# Patient Record
Sex: Female | Born: 1985 | Race: White | Hispanic: No | Smoking: Never smoker
Health system: Southern US, Community
[De-identification: ages and names within clinical notes are randomized; demographics above are authoritative.]

## PROBLEM LIST (undated history)

## (undated) ENCOUNTER — Inpatient Hospital Stay (HOSPITAL_COMMUNITY): Payer: Self-pay

## (undated) DIAGNOSIS — R002 Palpitations: Secondary | ICD-10-CM

## (undated) DIAGNOSIS — O139 Gestational [pregnancy-induced] hypertension without significant proteinuria, unspecified trimester: Secondary | ICD-10-CM

## (undated) DIAGNOSIS — G510 Bell's palsy: Secondary | ICD-10-CM

## (undated) DIAGNOSIS — D649 Anemia, unspecified: Secondary | ICD-10-CM

## (undated) DIAGNOSIS — R87619 Unspecified abnormal cytological findings in specimens from cervix uteri: Secondary | ICD-10-CM

## (undated) DIAGNOSIS — Z789 Other specified health status: Secondary | ICD-10-CM

## (undated) DIAGNOSIS — IMO0002 Reserved for concepts with insufficient information to code with codable children: Secondary | ICD-10-CM

## (undated) HISTORY — DX: Unspecified abnormal cytological findings in specimens from cervix uteri: R87.619

## (undated) HISTORY — DX: Gestational (pregnancy-induced) hypertension without significant proteinuria, unspecified trimester: O13.9

## (undated) HISTORY — DX: Reserved for concepts with insufficient information to code with codable children: IMO0002

## (undated) HISTORY — PX: NO PAST SURGERIES: SHX2092

## (undated) HISTORY — DX: Bell's palsy: G51.0

## (undated) HISTORY — DX: Anemia, unspecified: D64.9

---
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
## 2007-06-18 ENCOUNTER — Other Ambulatory Visit: Admission: RE | Admit: 2007-06-18 | Discharge: 2007-06-18 | Payer: Self-pay | Admitting: Obstetrics and Gynecology

## 2007-07-28 ENCOUNTER — Other Ambulatory Visit: Admission: RE | Admit: 2007-07-28 | Discharge: 2007-07-28 | Payer: Self-pay | Admitting: Obstetrics & Gynecology

## 2007-09-29 ENCOUNTER — Encounter: Payer: Self-pay | Admitting: Cardiology

## 2008-03-08 ENCOUNTER — Other Ambulatory Visit: Admission: RE | Admit: 2008-03-08 | Discharge: 2008-03-08 | Payer: Self-pay | Admitting: Obstetrics and Gynecology

## 2008-04-22 ENCOUNTER — Encounter: Payer: Self-pay | Admitting: Cardiology

## 2008-05-21 ENCOUNTER — Ambulatory Visit: Payer: Self-pay | Admitting: Cardiology

## 2008-05-21 ENCOUNTER — Encounter: Payer: Self-pay | Admitting: Cardiology

## 2008-05-31 ENCOUNTER — Ambulatory Visit: Payer: Self-pay | Admitting: Cardiology

## 2008-05-31 ENCOUNTER — Encounter: Payer: Self-pay | Admitting: Cardiology

## 2008-08-30 ENCOUNTER — Other Ambulatory Visit: Admission: RE | Admit: 2008-08-30 | Discharge: 2008-08-30 | Payer: Self-pay | Admitting: Obstetrics and Gynecology

## 2009-05-05 ENCOUNTER — Encounter (INDEPENDENT_AMBULATORY_CARE_PROVIDER_SITE_OTHER): Payer: Self-pay | Admitting: *Deleted

## 2009-05-05 DIAGNOSIS — R002 Palpitations: Secondary | ICD-10-CM | POA: Insufficient documentation

## 2010-05-08 ENCOUNTER — Other Ambulatory Visit: Admission: RE | Admit: 2010-05-08 | Discharge: 2010-05-08 | Payer: Self-pay | Admitting: Obstetrics and Gynecology

## 2011-01-23 NOTE — Assessment & Plan Note (Signed)
[facility]                          [facility] OFFICE NOTE   Heather, Matthews                        MRN:          [id number]  DATE:05/21/2008                            DOB:          [birth year]    REFERRING Heather Matthews:  Heather Kayser, PA-C   REASON FOR REFERRAL:  Palpitations.   HISTORY OF PRESENT ILLNESS:  [name] is a 25 year old female patient  who essentially has a negative past medical history.  She recently saw  [name] at [facility] with complaints of palpitations.  She was  initially treated with Klonopin.  However, the patient tells me that she  felt lethargic with this and this was eventually discontinued and she  was placed on metoprolol.  She was set up for an echocardiogram on  April 26, 2008.  This was completely normal with an EF of 60-65% and no  remarkable valvular abnormalities.  Her recent lab work at Ms.  Heather Matthews's office revealed a hemoglobin of 11.2.  She had a TSH drawn in  January, 2009, that was normal at 2.796.  With continued symptoms, she  has been referred now to Dr. Andee Matthews for further evaluation and  treatment.   The patient notes a history of near-syncope approximately 7-8 months  ago.  At that time, she was working and going to school.  She is in  [employer] in [place].  She was under quite a bit of stress  and had noted at least 2 or 3 occasions where she became near syncopal.  This is associated with diaphoresis, nausea, and tinnitus.  She never  really had a syncopal episode.  She has had occasional orthostasis in  the past, but this is infrequent.  She finally quit her job and her  symptoms subsided.  At that point in time, she was not eating well and  she was also not sleeping very well.   Over the last 4 weeks, the patient has noted palpitations.  She  describes this as a hard heart beat.  It feels as though it goes on  for a few seconds and then subsides.  Sometimes it goes on  all day long.  She does note some shortness of breath associated with this as well as a  lightheadedness or dizziness.  She denies any syncope or near-syncope.  She does note some chest soreness associated with it from time to time.  She denies any tachy palpitations.  She denies any heavy caffeine use.  She really does not abuse over-the-counter cold medications.  She denies  any supplement use.  However, she does note a vitamin that she took  several months ago that she obtained from [facility].  She has not taken this  in quite some time.  She was not having palpitations when she was taking  this and only took it for about 30 days.  She is active and works out  several times a week.  She denies ever having a history of syncope with  exertion.   PAST MEDICAL HISTORY:  Negative.   MEDICATIONS:  1. Metoprolol ER 25  mg daily.  2. Birth control pills as directed.   ALLERGIES:  No known drug allergies.   SOCIAL HISTORY:  She denies any tobacco, alcohol, or drug abuse.  She is  in school as outlined above.   FAMILY HISTORY:  Insignificant for premature CAD and first degree  relatives.   REVIEW OF SYSTEMS:  Please see HPI.  She denies any fevers, chills,  cough, melena, hematochezia, or hematuria.  Rest of the review of  systems are negative.   PHYSICAL EXAMINATION:  GENERAL:  She is well-nourished and well-  developed female.  VITAL SIGNS:  Blood pressure is 120/68, pulse 88, and weight 149 pounds.  HEENT:  Normal.  NECK:  Without JVD.  CARDIAC:  Normal S1 and S2.  Regular rate and rhythm without murmur.  LUNGS:  Clear to auscultation bilaterally without wheezes, rhonchi, or  rales.  ABDOMEN:  Soft and nontender with normoactive bowel sounds.  No  organomegaly.  EXTREMITIES:  Without edema.  Calves soft and nontender.  SKIN:  Warm and dry.  NEUROLOGIC:  She is alert and oriented x3.  Cranial II through XII are  grossly intact.  VASCULAR:  Without carotids bruits bilaterally.   ENDOCRINE:  Without thyromegaly.   Electrocardiogram reveals sinus rhythm with heart rate of 78, normal  axis, and no acute changes.   ASSESSMENT AND PLAN:  Palpitations.  She has had a recent echocardiogram  that reveals normal left ventricular structure and function as well as  no significant valvular abnormalities.  Her electrocardiogram is normal  today.  I suspect she is experiencing premature atrial contractions  versus premature ventricular contractions.  They are quite symptomatic.  She did not really have much in the way of relief with anxiolytics.  She  has not noticed much of a change with taking a beta-blocker.  However,  she only took this for a few days and has not taken it in quite some  time.  She has not had any symptoms in the last week.  We will set the  patient up for a 30-day CardioNet event monitor.  We will also switch  her metoprolol over to propranolol 20 mg daily p.r.n.  We will also  check orthostatic vital signs in the office today.  This will rule out  significant orthostatic blood pressure drop or heart rate increase.  At  this point in time, I do not think she has postural orthostatic  tachycardia syndrome.  The patient was also interviewed and examined by  Dr. Andee Matthews today.   DISPOSITION:  The patient will be brought back in follow up in the next  2 months to review the results of her event monitor.      Tereso Newcomer, PA-C  Electronically Signed      Learta Codding, MD,FACC  Electronically Signed   SW/MedQ  DD: 05/21/2008  DT: 05/22/2008  Job #: [id number]   cc:   Heather Kayser, PA-C

## 2012-04-24 ENCOUNTER — Other Ambulatory Visit: Payer: Self-pay | Admitting: Adult Health

## 2012-04-24 ENCOUNTER — Other Ambulatory Visit (HOSPITAL_COMMUNITY)
Admission: RE | Admit: 2012-04-24 | Discharge: 2012-04-24 | Disposition: A | Discharge status: Home / Self Care | Payer: Medicaid Other | Source: Ambulatory Visit | Attending: Obstetrics and Gynecology | Admitting: Obstetrics and Gynecology

## 2012-04-24 DIAGNOSIS — Z113 Encounter for screening for infections with a predominantly sexual mode of transmission: Secondary | ICD-10-CM | POA: Insufficient documentation

## 2012-04-24 DIAGNOSIS — Z01419 Encounter for gynecological examination (general) (routine) without abnormal findings: Secondary | ICD-10-CM | POA: Insufficient documentation

## 2012-04-24 LAB — OB RESULTS CONSOLE RUBELLA ANTIBODY, IGM: Rubella: IMMUNE

## 2012-04-24 LAB — OB RESULTS CONSOLE ANTIBODY SCREEN: Antibody Screen: NEGATIVE

## 2012-04-24 LAB — OB RESULTS CONSOLE VARICELLA ZOSTER ANTIBODY, IGG: Varicella: IMMUNE

## 2012-04-24 LAB — OB RESULTS CONSOLE ABO/RH: RH Type: POSITIVE

## 2012-04-24 LAB — OB RESULTS CONSOLE RPR: RPR: NONREACTIVE

## 2012-04-24 LAB — OB RESULTS CONSOLE HIV ANTIBODY (ROUTINE TESTING): HIV: NONREACTIVE

## 2012-04-24 LAB — OB RESULTS CONSOLE HEPATITIS B SURFACE ANTIGEN: Hepatitis B Surface Ag: NEGATIVE

## 2012-07-19 ENCOUNTER — Inpatient Hospital Stay (HOSPITAL_COMMUNITY)
Admission: AD | Admit: 2012-07-19 | Discharge: 2012-07-19 | Disposition: A | Discharge status: Home / Self Care | Payer: Medicaid Other | Source: Ambulatory Visit | Attending: Obstetrics and Gynecology | Admitting: Obstetrics and Gynecology

## 2012-07-19 ENCOUNTER — Encounter (HOSPITAL_COMMUNITY): Payer: Self-pay | Admitting: *Deleted

## 2012-07-19 DIAGNOSIS — O368190 Decreased fetal movements, unspecified trimester, not applicable or unspecified: Secondary | ICD-10-CM | POA: Insufficient documentation

## 2012-07-19 HISTORY — DX: Other specified health status: Z78.9

## 2012-07-19 LAB — URINALYSIS, ROUTINE W REFLEX MICROSCOPIC
Bilirubin Urine: NEGATIVE
Glucose, UA: NEGATIVE mg/dL
Hgb urine dipstick: NEGATIVE
Ketones, ur: NEGATIVE mg/dL
Leukocytes, UA: NEGATIVE
Nitrite: NEGATIVE
Protein, ur: NEGATIVE mg/dL
Specific Gravity, Urine: <1.005 — ABNORMAL LOW (ref 1.005–1.030)
Urobilinogen, UA: 0.2 mg/dL (ref 0.0–1.0)
pH: 6 (ref 5.0–8.0)

## 2012-07-19 NOTE — MAU Provider Note (Signed)
History     CSN: [id number]  Arrival date & time 07/19/12  1408   None     Chief Complaint  Patient presents with  . Decreased Fetal Movement    (Consider location/radiation/quality/duration/timing/severity/associated sxs/prior treatment) HPI Heather Matthews is a 26 y.o. G1P0 at [redacted]w[redacted]d. She presents with c/o no fetal movement since last evening. No bleeding or leaking. Has bil low abd pressure off/on, not new. PNC with [facility], last visit this week.  Past Medical History  Diagnosis Date  . No pertinent past medical history     Past Surgical History  Procedure Date  . No past surgeries     No family history on file.  History  Substance Use Topics  . Smoking status: Never Smoker   . Smokeless tobacco: Not on file  . Alcohol Use: No    OB History    Grav Para Term Preterm Abortions TAB SAB Ect Mult Living   1               Review of Systems  Constitutional: Negative for fever and chills.  Gastrointestinal: Negative for diarrhea and constipation.  Genitourinary: Negative for dysuria, urgency, frequency, vaginal bleeding and vaginal discharge.       Pelvic pressure    Allergies  Review of patient's allergies indicates not on file.  Home Medications  No current outpatient prescriptions on file.  BP 135/70  Pulse 98  Temp 98.1 F (36.7 C) (Oral)  Resp 18  Ht 5\' 2"  (1.575 m)  Wt 165 lb 6.4 oz (75.025 kg)  BMI 30.25 kg/m2  Physical Exam  Constitutional: She is oriented to person, place, and time. She appears well-developed and well-nourished.  Musculoskeletal: Normal range of motion.  Neurological: She is alert and oriented to person, place, and time.  Skin: Skin is warm and dry.  Psychiatric: She has a normal mood and affect. Her behavior is normal.    ED Course  Procedures (including critical care time)   Labs Reviewed  URINALYSIS, ROUTINE W REFLEX MICROSCOPIC   No results found.  Results for orders placed during the hospital encounter of  07/19/12 (from the past 24 hour(s))  URINALYSIS, ROUTINE W REFLEX MICROSCOPIC     Status: Abnormal   Collection Time   07/19/12  2:28 PM      Component Value Range   Color, Urine YELLOW  YELLOW   APPearance CLEAR  CLEAR   Specific Gravity, Urine <1.005 (*) 1.005 - 1.030   pH 6.0  5.0 - 8.0   Glucose, UA NEGATIVE  NEGATIVE mg/dL   Hgb urine dipstick NEGATIVE  NEGATIVE   Bilirubin Urine NEGATIVE  NEGATIVE   Ketones, ur NEGATIVE  NEGATIVE mg/dL   Protein, ur NEGATIVE  NEGATIVE mg/dL   Urobilinogen, UA 0.2  0.0 - 1.0 mg/dL   Nitrite NEGATIVE  NEGATIVE   Leukocytes, UA NEGATIVE  NEGATIVE    No diagnosis found.    MDM  A- Normal pregnancy, good fetal heart tones, neg urine  P- Continue to monitor symptoms Keep scheduled prenatal visit in 3 wks at [facility]

## 2012-07-19 NOTE — MAU Note (Signed)
Pt stated she has not felt baby move since last night. Has been feeling regular movment for several weeks now. Pt reports having increased vaginal pressure and ligament pain.

## 2012-08-20 ENCOUNTER — Other Ambulatory Visit: Payer: Self-pay | Admitting: Adult Health

## 2012-08-20 ENCOUNTER — Other Ambulatory Visit (HOSPITAL_COMMUNITY)
Admission: RE | Admit: 2012-08-20 | Discharge: 2012-08-20 | Disposition: A | Discharge status: Home / Self Care | Payer: Self-pay | Source: Ambulatory Visit | Attending: Obstetrics and Gynecology | Admitting: Obstetrics and Gynecology

## 2012-08-20 DIAGNOSIS — N6459 Other signs and symptoms in breast: Secondary | ICD-10-CM | POA: Insufficient documentation

## 2012-09-08 LAB — OB RESULTS CONSOLE HIV ANTIBODY (ROUTINE TESTING): HIV: NONREACTIVE

## 2012-09-08 LAB — OB RESULTS CONSOLE RPR: RPR: NONREACTIVE

## 2012-09-10 NOTE — L&D Delivery Note (Signed)
Delivery Note Pt had pushed a total of 3 hours with rests inbetween and was becoming exhausted. Dr Jolayne Panther was requested to come and evaluate the situation for appropriateness of vacuum for assistance. The pt and family were in agreement after the R/Bs were reviewed. The Kiwi vacuum was applied and gentle traction was used by Dr Jolayne Panther without any popoffs until the vtx was crowning at which point the vacuum was removed and Dr Konrad Dolores completed the birth.   At 7:04 AM a viable female was delivered via Vaginal, Vacuum (Extractor) (Presentation: ; Occiput Anterior).  APGAR: 8, 9; weight 7 lb 6.9 oz (3370 g).   Placenta status: Intact, Spontaneous.  Cord: 3 vessels with the following complications: Short.     Anesthesia: Epidural  Episiotomy: None Lacerations: None Est. Blood Loss (mL): 200  Mom to postpartum.  Baby to nursery-stable.  MERRELL, DAVID, MD Family Medicine Resident PGY2 12/18/2012, 7:25 AM  I have seen and examined this patient and I agree with the above. I was present for the delivery. Mansfield, Chevon Fomby 9:02 AM 12/20/2012

## 2012-09-10 NOTE — L&D Delivery Note (Signed)
Attestation of Attending Supervision of Advanced Practitioner (CNM/NP): Evaluation and management procedures were performed by the Advanced Practitioner under my supervision and collaboration.  I have reviewed the Advanced Practitioner's note and chart, and I agree with the management and plan.  Heather Matthews 12/23/2012 8:34 AM

## 2012-11-07 ENCOUNTER — Encounter (HOSPITAL_COMMUNITY): Payer: Self-pay

## 2012-11-07 ENCOUNTER — Inpatient Hospital Stay (HOSPITAL_COMMUNITY)
Admission: AD | Admit: 2012-11-07 | Discharge: 2012-11-07 | Disposition: A | Discharge status: Home / Self Care | Payer: Medicaid Other | Source: Ambulatory Visit | Attending: Obstetrics and Gynecology | Admitting: Obstetrics and Gynecology

## 2012-11-07 DIAGNOSIS — O99891 Other specified diseases and conditions complicating pregnancy: Secondary | ICD-10-CM | POA: Insufficient documentation

## 2012-11-07 DIAGNOSIS — R079 Chest pain, unspecified: Secondary | ICD-10-CM | POA: Insufficient documentation

## 2012-11-07 DIAGNOSIS — M545 Low back pain, unspecified: Secondary | ICD-10-CM | POA: Insufficient documentation

## 2012-11-07 DIAGNOSIS — R0602 Shortness of breath: Secondary | ICD-10-CM | POA: Insufficient documentation

## 2012-11-07 DIAGNOSIS — K529 Noninfective gastroenteritis and colitis, unspecified: Secondary | ICD-10-CM

## 2012-11-07 DIAGNOSIS — A088 Other specified intestinal infections: Secondary | ICD-10-CM | POA: Insufficient documentation

## 2012-11-07 DIAGNOSIS — R197 Diarrhea, unspecified: Secondary | ICD-10-CM | POA: Insufficient documentation

## 2012-11-07 DIAGNOSIS — K5289 Other specified noninfective gastroenteritis and colitis: Secondary | ICD-10-CM

## 2012-11-07 HISTORY — DX: Other specified health status: Z78.9

## 2012-11-07 LAB — URINALYSIS, ROUTINE W REFLEX MICROSCOPIC
Bilirubin Urine: NEGATIVE
Glucose, UA: NEGATIVE mg/dL
Ketones, ur: 40 mg/dL — AB
Nitrite: NEGATIVE
Protein, ur: NEGATIVE mg/dL
Specific Gravity, Urine: 1.02 (ref 1.005–1.030)
Urobilinogen, UA: 0.2 mg/dL (ref 0.0–1.0)
pH: 6 (ref 5.0–8.0)

## 2012-11-07 LAB — URINE MICROSCOPIC-ADD ON

## 2012-11-07 MED ORDER — ONDANSETRON 4 MG PO TBDP: 4.0000 mg | ORAL_TABLET | Freq: Three times a day (TID) | ORAL | Status: DC | PRN

## 2012-11-07 NOTE — MAU Note (Signed)
Had back pain this morning. Went to work. This afternoon started having nausea, more back pain and shortness of breath. Diarrhea for 2 days

## 2012-11-07 NOTE — MAU Provider Note (Signed)
History     CSN: [id number]  Arrival date and time: 11/07/12 2157   None     Chief Complaint  Patient presents with  . Back Pain  . Nausea  . Shortness of Breath   HPI Ms Heather Matthews is a 27 yo G1P0 at 35.4wks who presents for eval of occ diarrhea x 2d, nausea today and SOB/chest tightness today. No cough/fever.  Also with lower back/tailbone discomfort. Denies leak or bldg or reg ctx. Reports +FM. She receives prenatal care at [facility].  OB History   Grav Para Term Preterm Abortions TAB SAB Ect Mult Living   1 0 0 0 0 0 0 0 0 0       Past Medical History  Diagnosis Date  . No pertinent past medical history   . Medical history non-contributory     Past Surgical History  Procedure Laterality Date  . No past surgeries      History reviewed. No pertinent family history.  History  Substance Use Topics  . Smoking status: Never Smoker   . Smokeless tobacco: Not on file  . Alcohol Use: No    Allergies: No Known Allergies  Prescriptions prior to admission  Medication Sig Dispense Refill  . acetaminophen (TYLENOL) 325 MG tablet Take 325 mg by mouth every 6 (six) hours as needed. For headache      . flintstones complete (FLINTSTONES) 60 MG chewable tablet Chew 2 tablets by mouth daily.      . ondansetron (ZOFRAN-ODT) 8 MG disintegrating tablet Take 8 mg by mouth every 8 (eight) hours as needed. For nausea and vomiting      . [DISCONTINUED] promethazine (PHENERGAN) 12.5 MG tablet Take 12.5 mg by mouth every 8 (eight) hours as needed. For nausea        ROS Physical Exam   Blood pressure 143/78, pulse 111, temperature 97.6 F (36.4 C), temperature source Oral, resp. rate 22, height 5' 1.5" (1.562 m), weight 187 lb (84.823 kg), SpO2 99.00%.  Physical Exam  Constitutional: She is oriented to person, place, and time. She appears well-developed.  HENT:  Head: Normocephalic.  Neck: Normal range of motion.  Cardiovascular:  Tachycardic initially, then near d/c pulse 103   Respiratory: Effort normal and breath sounds normal.  Lungs clear in all quads, no wheezes or rales  GI:  Gravid FHR 135 +accels, no decels Rare ctx per toco  Genitourinary: Vagina normal.  Cx closed/50%  Musculoskeletal: Normal range of motion.  Neurological: She is alert and oriented to person, place, and time.  Skin: Skin is warm and dry.  Psychiatric: She has a normal mood and affect. Her behavior is normal. Thought content normal.   Urinalysis    Component Value Date/Time   COLORURINE YELLOW 11/07/2012 2200   APPEARANCEUR CLEAR 11/07/2012 2200   LABSPEC 1.020 11/07/2012 2200   PHURINE 6.0 11/07/2012 2200   GLUCOSEU NEGATIVE 11/07/2012 2200   HGBUR TRACE* 11/07/2012 2200   BILIRUBINUR NEGATIVE 11/07/2012 2200   KETONESUR 40* 11/07/2012 2200   PROTEINUR NEGATIVE 11/07/2012 2200   UROBILINOGEN 0.2 11/07/2012 2200   NITRITE NEGATIVE 11/07/2012 2200   LEUKOCYTESUR SMALL* 11/07/2012 2200      MAU Course  Procedures  MDM Allowed pt to rest and drink fluids; feels that breathing is easier but chest feels slightly tight; will hydrate with PO fluids rather than IV.  Assessment and Plan  IUP at 35.4wks Viral gastroenteritis  D/C home Rev'd instructions to rest, increase fluids, Zofran rx for prn F/U at  [facility] as scheduled on 3/3   Cam Hai 11/07/2012, 11:47 PM

## 2012-11-12 NOTE — MAU Provider Note (Signed)
Attestation of Attending Supervision of Advanced Practitioner (CNM/NP): Evaluation and management procedures were performed by the Advanced Practitioner under my supervision and collaboration.  I have reviewed the Advanced Practitioner's note and chart, and I agree with the management and plan.  Zohan Shiflet 11/12/2012 9:24 AM

## 2012-11-17 LAB — OB RESULTS CONSOLE GBS: GBS: NEGATIVE

## 2012-11-17 LAB — OB RESULTS CONSOLE GC/CHLAMYDIA: Chlamydia: NEGATIVE

## 2012-11-20 ENCOUNTER — Inpatient Hospital Stay (HOSPITAL_COMMUNITY)
Admission: AD | Admit: 2012-11-20 | Discharge: 2012-11-21 | Disposition: A | Discharge status: Home / Self Care | Payer: Medicaid Other | Source: Ambulatory Visit | Attending: Obstetrics and Gynecology | Admitting: Obstetrics and Gynecology

## 2012-11-20 DIAGNOSIS — O479 False labor, unspecified: Secondary | ICD-10-CM | POA: Insufficient documentation

## 2012-11-20 NOTE — MAU Note (Signed)
Pt states she has had contractions q 5-10 min apart since 1800

## 2012-11-21 NOTE — Discharge Instructions (Signed)
Fetal Movement Counts Patient Name: __________________________________________________ Patient Due Date: ____________________ Kick counts is highly recommended in high risk pregnancies, but it is a good idea for every pregnant woman to do. Start counting fetal movements at 28 weeks of the pregnancy. Fetal movements increase after eating a full meal or eating or drinking something sweet (the blood sugar is higher). It is also important to drink plenty of fluids (well hydrated) before doing the count. Lie on your left side because it helps with the circulation or you can sit in a comfortable chair with your arms over your belly (abdomen) with no distractions around you. DOING THE COUNT  Try to do the count the same time of day each time you do it.  Mark the day and time, then see how long it takes for you to feel 10 movements (kicks, flutters, swishes, rolls). You should have at least 10 movements within 2 hours. You will most likely feel 10 movements in much less than 2 hours. If you do not, wait an hour and count again. After a couple of days you will see a pattern.  What you are looking for is a change in the pattern or not enough counts in 2 hours. Is it taking longer in time to reach 10 movements? SEEK MEDICAL CARE IF:  You feel less than 10 counts in 2 hours. Tried twice.  No movement in one hour.  The pattern is changing or taking longer each day to reach 10 counts in 2 hours.  You feel the baby is not moving as it usually does. Date: ____________ Movements: ____________ Start time: ____________ Finish time: ____________  Date: ____________ Movements: ____________ Start time: ____________ Finish time: ____________ Date: ____________ Movements: ____________ Start time: ____________ Finish time: ____________ Date: ____________ Movements: ____________ Start time: ____________ Finish time: ____________ Date: ____________ Movements: ____________ Start time: ____________ Finish time:  ____________ Date: ____________ Movements: ____________ Start time: ____________ Finish time: ____________ Date: ____________ Movements: ____________ Start time: ____________ Finish time: ____________ Date: ____________ Movements: ____________ Start time: ____________ Finish time: ____________  Date: ____________ Movements: ____________ Start time: ____________ Finish time: ____________ Date: ____________ Movements: ____________ Start time: ____________ Finish time: ____________ Date: ____________ Movements: ____________ Start time: ____________ Finish time: ____________ Date: ____________ Movements: ____________ Start time: ____________ Finish time: ____________ Date: ____________ Movements: ____________ Start time: ____________ Finish time: ____________ Date: ____________ Movements: ____________ Start time: ____________ Finish time: ____________ Date: ____________ Movements: ____________ Start time: ____________ Finish time: ____________  Date: ____________ Movements: ____________ Start time: ____________ Finish time: ____________ Date: ____________ Movements: ____________ Start time: ____________ Finish time: ____________ Date: ____________ Movements: ____________ Start time: ____________ Finish time: ____________ Date: ____________ Movements: ____________ Start time: ____________ Finish time: ____________ Date: ____________ Movements: ____________ Start time: ____________ Finish time: ____________ Date: ____________ Movements: ____________ Start time: ____________ Finish time: ____________ Date: ____________ Movements: ____________ Start time: ____________ Finish time: ____________  Date: ____________ Movements: ____________ Start time: ____________ Finish time: ____________ Date: ____________ Movements: ____________ Start time: ____________ Finish time: ____________ Date: ____________ Movements: ____________ Start time: ____________ Finish time: ____________ Date: ____________ Movements:  ____________ Start time: ____________ Finish time: ____________ Date: ____________ Movements: ____________ Start time: ____________ Finish time: ____________ Date: ____________ Movements: ____________ Start time: ____________ Finish time: ____________ Date: ____________ Movements: ____________ Start time: ____________ Finish time: ____________  Date: ____________ Movements: ____________ Start time: ____________ Finish time: ____________ Date: ____________ Movements: ____________ Start time: ____________ Finish time: ____________ Date: ____________ Movements: ____________ Start time: ____________ Finish time: ____________ Date: ____________ Movements:   ____________ Start time: ____________ Finish time: ____________ Date: ____________ Movements: ____________ Start time: ____________ Finish time: ____________ Date: ____________ Movements: ____________ Start time: ____________ Finish time: ____________ Date: ____________ Movements: ____________ Start time: ____________ Finish time: ____________  Date: ____________ Movements: ____________ Start time: ____________ Finish time: ____________ Date: ____________ Movements: ____________ Start time: ____________ Finish time: ____________ Date: ____________ Movements: ____________ Start time: ____________ Finish time: ____________ Date: ____________ Movements: ____________ Start time: ____________ Finish time: ____________ Date: ____________ Movements: ____________ Start time: ____________ Finish time: ____________ Date: ____________ Movements: ____________ Start time: ____________ Finish time: ____________ Date: ____________ Movements: ____________ Start time: ____________ Finish time: ____________  Date: ____________ Movements: ____________ Start time: ____________ Finish time: ____________ Date: ____________ Movements: ____________ Start time: ____________ Finish time: ____________ Date: ____________ Movements: ____________ Start time: ____________ Finish  time: ____________ Date: ____________ Movements: ____________ Start time: ____________ Finish time: ____________ Date: ____________ Movements: ____________ Start time: ____________ Finish time: ____________ Date: ____________ Movements: ____________ Start time: ____________ Finish time: ____________ Date: ____________ Movements: ____________ Start time: ____________ Finish time: ____________  Date: ____________ Movements: ____________ Start time: ____________ Finish time: ____________ Date: ____________ Movements: ____________ Start time: ____________ Finish time: ____________ Date: ____________ Movements: ____________ Start time: ____________ Finish time: ____________ Date: ____________ Movements: ____________ Start time: ____________ Finish time: ____________ Date: ____________ Movements: ____________ Start time: ____________ Finish time: ____________ Date: ____________ Movements: ____________ Start time: ____________ Finish time: ____________ Document Released: 09/26/2006 Document Revised: 11/19/2011 Document Reviewed: 03/29/2009 [employer] Patient Information 2013 [employer].  Braxton Hicks Contractions Pregnancy is commonly associated with contractions of the uterus throughout the pregnancy. Towards the end of pregnancy (32 to 34 weeks), these contractions (Braxton Hicks) can develop more often and may become more forceful. This is not true labor because these contractions do not result in opening (dilatation) and thinning of the cervix. They are sometimes difficult to tell apart from true labor because these contractions can be forceful and people have different pain tolerances. You should not feel embarrassed if you go to the hospital with false labor. Sometimes, the only way to tell if you are in true labor is for your caregiver to follow the changes in the cervix. How to tell the difference between true and false labor:  False labor.  The contractions of false labor are usually  shorter, irregular and not as hard as those of true labor.  They are often felt in the front of the lower abdomen and in the groin.  They may leave with walking around or changing positions while lying down.  They get weaker and are shorter lasting as time goes on.  These contractions are usually irregular.  They do not usually become progressively stronger, regular and closer together as with true labor.  True labor.  Contractions in true labor last 30 to 70 seconds, become very regular, usually become more intense, and increase in frequency.  They do not go away with walking.  The discomfort is usually felt in the top of the uterus and spreads to the lower abdomen and low back.  True labor can be determined by your caregiver with an exam. This will show that the cervix is dilating and getting thinner. If there are no prenatal problems or other health problems associated with the pregnancy, it is completely safe to be sent home with false labor and await the onset of true labor. HOME CARE INSTRUCTIONS   Keep up with your usual exercises and instructions.  Take medications as directed.  Keep your regular prenatal appointment.    Eat and drink lightly if you think you are going into labor.  If BH contractions are making you uncomfortable:  Change your activity position from lying down or resting to walking/walking to resting.  Sit and rest in a tub of warm water.  Drink 2 to 3 glasses of water. Dehydration may cause B-H contractions.  Do slow and deep breathing several times an hour. SEEK IMMEDIATE MEDICAL CARE IF:   Your contractions continue to become stronger, more regular, and closer together.  You have a gushing, burst or leaking of fluid from the vagina.  An oral temperature above 102 F (38.9 C) develops.  You have passage of blood-tinged mucus.  You develop vaginal bleeding.  You develop continuous belly (abdominal) pain.  You have low back pain that you  never had before.  You feel the baby's head pushing down causing pelvic pressure.  The baby is not moving as much as it used to. Document Released: 08/27/2005 Document Revised: 11/19/2011 Document Reviewed: 02/18/2009 [employer] Patient Information 2013 [employer].  

## 2012-11-22 ENCOUNTER — Encounter: Payer: Self-pay | Admitting: *Deleted

## 2012-11-26 ENCOUNTER — Telehealth: Payer: Self-pay | Admitting: *Deleted

## 2012-11-26 NOTE — Telephone Encounter (Signed)
Pt called and c/o contractions and pressure. She denied any gush of fluid or bleeding. She did have positive baby movement. She stated that she had not timed her contractions. I advised the pt time the contractions for the next hour and call me back and let me know how far apart they are. I also advised that if her contractions were 5 to 7 minutes apart she needed to go straight to [facility]. Pt voiced understanding.

## 2012-11-26 NOTE — Telephone Encounter (Signed)
Called pt back to check on her, asked her how far apart her contractions were and she stated that they were not close together at all and that they were not consistent. I advised her to go straight to [facility] if she had a gush of fluid, bleeding, or decreased fetal movement. Pt verbalized understanding.

## 2012-12-01 ENCOUNTER — Encounter: Payer: Self-pay | Admitting: Obstetrics and Gynecology

## 2012-12-01 ENCOUNTER — Encounter (HOSPITAL_COMMUNITY): Payer: Self-pay | Admitting: *Deleted

## 2012-12-01 ENCOUNTER — Telehealth: Payer: Self-pay | Admitting: Adult Health

## 2012-12-01 ENCOUNTER — Inpatient Hospital Stay (HOSPITAL_COMMUNITY)
Admission: AD | Admit: 2012-12-01 | Discharge: 2012-12-01 | Disposition: A | Discharge status: Home / Self Care | Payer: Medicaid Other | Source: Ambulatory Visit | Attending: Obstetrics & Gynecology | Admitting: Obstetrics & Gynecology

## 2012-12-01 DIAGNOSIS — N760 Acute vaginitis: Secondary | ICD-10-CM | POA: Insufficient documentation

## 2012-12-01 DIAGNOSIS — A499 Bacterial infection, unspecified: Secondary | ICD-10-CM | POA: Insufficient documentation

## 2012-12-01 DIAGNOSIS — O479 False labor, unspecified: Secondary | ICD-10-CM | POA: Insufficient documentation

## 2012-12-01 DIAGNOSIS — B9689 Other specified bacterial agents as the cause of diseases classified elsewhere: Secondary | ICD-10-CM

## 2012-12-01 DIAGNOSIS — O471 False labor at or after 37 completed weeks of gestation: Secondary | ICD-10-CM

## 2012-12-01 DIAGNOSIS — O2390 Unspecified genitourinary tract infection in pregnancy, unspecified trimester: Secondary | ICD-10-CM | POA: Insufficient documentation

## 2012-12-01 LAB — PROTEIN / CREATININE RATIO, URINE
Creatinine, Urine: 79.48 mg/dL
Protein Creatinine Ratio: 0.08 (ref 0.00–0.15)
Total Protein, Urine: 6.7 mg/dL

## 2012-12-01 LAB — CBC
HCT: 33.1 % — ABNORMAL LOW (ref 36.0–46.0)
Hemoglobin: 10.8 g/dL — ABNORMAL LOW (ref 12.0–15.0)
MCH: 26.7 pg (ref 26.0–34.0)
MCHC: 32.6 g/dL (ref 30.0–36.0)
MCV: 81.9 fL (ref 78.0–100.0)
Platelets: 324 10*3/uL (ref 150–400)
RBC: 4.04 MIL/uL (ref 3.87–5.11)
RDW: 14.4 % (ref 11.5–15.5)
WBC: 13.9 10*3/uL — ABNORMAL HIGH (ref 4.0–10.5)

## 2012-12-01 LAB — URINALYSIS, DIPSTICK ONLY
Ketones, ur: 15 mg/dL — AB
Leukocytes, UA: NEGATIVE
Nitrite: NEGATIVE
Protein, ur: NEGATIVE mg/dL
Urobilinogen, UA: 0.2 mg/dL (ref 0.0–1.0)

## 2012-12-01 LAB — COMPREHENSIVE METABOLIC PANEL
ALT: 5 U/L (ref 0–35)
AST: 11 U/L (ref 0–37)
Albumin: 2.6 g/dL — ABNORMAL LOW (ref 3.5–5.2)
Alkaline Phosphatase: 162 U/L — ABNORMAL HIGH (ref 39–117)
BUN: 10 mg/dL (ref 6–23)
CO2: 19 mEq/L (ref 19–32)
Calcium: 9.3 mg/dL (ref 8.4–10.5)
Chloride: 102 mEq/L (ref 96–112)
Creatinine, Ser: 0.48 mg/dL — ABNORMAL LOW (ref 0.50–1.10)
GFR calc Af Amer: >90 mL/min (ref >90)
GFR calc non Af Amer: >90 mL/min (ref >90)
Glucose, Bld: 76 mg/dL (ref 70–99)
Potassium: 4.1 mEq/L (ref 3.5–5.1)
Sodium: 134 mEq/L — ABNORMAL LOW (ref 135–145)
Total Bilirubin: 0.2 mg/dL — ABNORMAL LOW (ref 0.3–1.2)
Total Protein: 6.3 g/dL (ref 6.0–8.3)

## 2012-12-01 LAB — POCT FERN TEST: POCT Fern Test: NEGATIVE

## 2012-12-01 LAB — WET PREP, GENITAL
Trich, Wet Prep: NONE SEEN
Yeast Wet Prep HPF POC: NONE SEEN

## 2012-12-01 MED ORDER — METRONIDAZOLE 500 MG PO TABS: 500.0000 mg | ORAL_TABLET | Freq: Two times a day (BID) | ORAL | Status: DC

## 2012-12-01 NOTE — MAU Note (Signed)
Patient states she has had a couple of episodes of leaking clear fluid twice yesterday. The first time at 1900 3-23. None today. Patient states she has been having contractions every 4-5 minutes. Reports not feeling any movement today. Fetal heart tones in the 120's in triage.

## 2012-12-01 NOTE — Telephone Encounter (Signed)
Spoke with Megans boyfriend, they are at the hospital now. Not ruptured, call if needs any thing.

## 2012-12-01 NOTE — MAU Provider Note (Signed)
History   27 y/o G1P0 at [redacted]w[redacted]d with borderline blood pressures presenting with leakage of fluid for 1 day  CSN: [id number]  Arrival date and time: 12/01/12 1203   None     Chief Complaint  Patient presents with  . Vaginal Discharge  . Labor Eval   Vaginal Discharge The patient's primary symptoms include a vaginal discharge. The patient's pertinent negatives include no genital itching, genital lesions, genital odor, pelvic pain or vaginal bleeding. This is a new problem. The current episode started today. The problem occurs constantly. The problem has been unchanged. The pain is mild. The problem affects both sides. She is pregnant. Associated symptoms include abdominal pain and back pain. Pertinent negatives include no chills, constipation, diarrhea, dysuria, fever, flank pain, frequency, headaches, joint pain, nausea, rash, sore throat, urgency or vomiting. The vaginal discharge was clear and watery. There has been no bleeding. She has not been passing clots. She has not been passing tissue. Nothing aggravates the symptoms. She has tried nothing for the symptoms. The treatment provided no relief. She is sexually active. No, her partner does not have an STD. She uses nothing for contraception.   # Leakage of fluid: Pt mentions developing leakage of fluid since yesterday afternoon described a clear fluid with constant leak.  Associated with decreased fetal movement and contraction like pain over her lower abdomen that is 8/10 with contraction.  Pt denies blood, pain with urination or increased urinary frequency, vaginal itching or discharge.    OB History   Grav Para Term Preterm Abortions TAB SAB Ect Mult Living   1 0 0 0 0 0 0 0 0 0       Past Medical History  Diagnosis Date  . No pertinent past medical history   . Medical history non-contributory   . Abnormal Pap smear   . Anemia     Past Surgical History  Procedure Laterality Date  . No past surgeries      Family History   Problem Relation Age of Onset  . Diabetes Other   . Hypertension Other     History  Substance Use Topics  . Smoking status: Never Smoker   . Smokeless tobacco: Not on file  . Alcohol Use: No    Allergies: No Known Allergies  Prescriptions prior to admission  Medication Sig Dispense Refill  . calcium carbonate (TUMS - DOSED IN MG ELEMENTAL CALCIUM) 500 MG chewable tablet Chew 3 tablets by mouth 3 (three) times daily as needed for heartburn.      . flintstones complete (FLINTSTONES) 60 MG chewable tablet Chew 2 tablets by mouth daily.      . ondansetron (ZOFRAN ODT) 4 MG disintegrating tablet Take 1 tablet (4 mg total) by mouth every 8 (eight) hours as needed for nausea.  30 tablet  1    Review of Systems  Constitutional: Negative for fever and chills.  HENT: Negative for sore throat.   Eyes: Negative for blurred vision and double vision.  Respiratory: Negative for cough and wheezing.   Cardiovascular: Negative for chest pain, palpitations and leg swelling.  Gastrointestinal: Positive for abdominal pain. Negative for heartburn, nausea, vomiting, diarrhea and constipation.  Genitourinary: Positive for vaginal discharge. Negative for dysuria, urgency, frequency, flank pain and pelvic pain.  Musculoskeletal: Positive for back pain. Negative for myalgias and joint pain.  Skin: Negative for itching and rash.  Neurological: Negative for headaches.  Endo/Heme/Allergies: Negative for environmental allergies. Does not bruise/bleed easily.  All other systems reviewed and are  negative.   Physical Exam   Blood pressure 131/73, pulse 99, temperature 98 F (36.7 C), temperature source Oral, resp. rate 18, height 5\' 2"  (1.575 m), weight 87.181 kg (192 lb 3.2 oz), last menstrual period 02/19/2012, SpO2 100.00%.  Physical Exam  Constitutional: She is oriented to person, place, and time. She appears well-developed and well-nourished. No distress.  HENT:  Head: Normocephalic and atraumatic.   Eyes: EOM are normal. Pupils are equal, round, and reactive to light.  Neck: Normal range of motion. Neck supple.  Cardiovascular: Normal rate, regular rhythm and normal heart sounds.  Exam reveals no gallop.   No murmur heard. Respiratory: Effort normal and breath sounds normal. No respiratory distress. She has no wheezes. She has no rales.  GI: Soft. Bowel sounds are normal. She exhibits no distension. There is no tenderness. There is no rebound, no guarding and no CVA tenderness.  Genitourinary: Vagina normal. No vaginal discharge found.  Sterile speculum exam: no pooling of clear fluid w/in the vaginal vault, or fluid extruded from os with valsalva.  Scant white vaginal discharge appreciated.   Musculoskeletal: Normal range of motion. She exhibits no edema and no tenderness.  Neurological: She is alert and oriented to person, place, and time. She has normal reflexes.  Skin: Skin is warm and dry. She is not diaphoretic.  Psychiatric: She has a normal mood and affect. Her behavior is normal. Thought content normal.   Results for orders placed during the hospital encounter of 12/01/12 (from the past 24 hour(s))  WET PREP, GENITAL     Status: Abnormal   Collection Time    12/01/12  1:15 PM      Result Value Range   Yeast Wet Prep HPF POC NONE SEEN  NONE SEEN   Trich, Wet Prep NONE SEEN  NONE SEEN   Clue Cells Wet Prep HPF POC MODERATE (*) NONE SEEN   WBC, Wet Prep HPF POC MODERATE (*) NONE SEEN  CBC     Status: Abnormal   Collection Time    12/01/12  1:20 PM      Result Value Range   WBC 13.9 (*) 4.0 - 10.5 K/uL   RBC 4.04  3.87 - 5.11 MIL/uL   Hemoglobin 10.8 (*) 12.0 - 15.0 g/dL   HCT 14.7 (*) 82.9 - 56.2 %   MCV 81.9  78.0 - 100.0 fL   MCH 26.7  26.0 - 34.0 pg   MCHC 32.6  30.0 - 36.0 g/dL   RDW 13.0  86.5 - 78.4 %   Platelets 324  150 - 400 K/uL  COMPREHENSIVE METABOLIC PANEL     Status: Abnormal   Collection Time    12/01/12  1:20 PM      Result Value Range   Sodium 134  (*) 135 - 145 mEq/L   Potassium 4.1  3.5 - 5.1 mEq/L   Chloride 102  96 - 112 mEq/L   CO2 19  19 - 32 mEq/L   Glucose, Bld 76  70 - 99 mg/dL   BUN 10  6 - 23 mg/dL   Creatinine, Ser 6.96 (*) 0.50 - 1.10 mg/dL   Calcium 9.3  8.4 - 29.5 mg/dL   Total Protein 6.3  6.0 - 8.3 g/dL   Albumin 2.6 (*) 3.5 - 5.2 g/dL   AST 11  0 - 37 U/L   ALT 5  0 - 35 U/L   Alkaline Phosphatase 162 (*) 39 - 117 U/L   Total Bilirubin 0.2 (*)  0.3 - 1.2 mg/dL   GFR calc non Af Amer >90  >90 mL/min   GFR calc Af Amer >90  >90 mL/min  POCT FERN TEST     Status: None   Collection Time    12/01/12  1:27 PM      Result Value Range   POCT Fern Test Negative = intact amniotic membranes     FHR: Baseline: 130, accelerations present, no decelerations, no contractions, moderate variability category 1 tracing  MAU Course  Procedures  MDM   Assessment and Plan   27 y/o G1P0 at [redacted]w[redacted]d with borderline blood pressures presenting with leakage of fluid for 1 day  # False Labor: Pt presents with cc of SROM at 1200 11/30/12.  Negative ferning and pooling on exam -- d/c to home -- f/u w/ pcp in 3-4 days  # Bacterial vaginosis: Pt presented with concern of contractions and SROM.  Negative ferning on microscopic exam w/ moderate clue cells on wet prep -- metronidazole 500 mg po bid x 7 days  # Pre-hypertensive: Pt has history of periodic elevated blood pressures with normal PIH labs os outpatient. -- cbc, cmp wnl for platelets, LFTs -- protein/creatinine pending, will f/u if elevated -- d/c to home  Gregor Hams 12/01/2012, 2:51 PM

## 2012-12-03 ENCOUNTER — Ambulatory Visit (INDEPENDENT_AMBULATORY_CARE_PROVIDER_SITE_OTHER): Payer: BC Managed Care – PPO | Admitting: Obstetrics & Gynecology

## 2012-12-03 VITALS — BP 140/70 | Wt 192.0 lb

## 2012-12-03 DIAGNOSIS — Z3403 Encounter for supervision of normal first pregnancy, third trimester: Secondary | ICD-10-CM

## 2012-12-03 DIAGNOSIS — Z3400 Encounter for supervision of normal first pregnancy, unspecified trimester: Secondary | ICD-10-CM | POA: Insufficient documentation

## 2012-12-03 DIAGNOSIS — Z3490 Encounter for supervision of normal pregnancy, unspecified, unspecified trimester: Secondary | ICD-10-CM

## 2012-12-03 DIAGNOSIS — O99019 Anemia complicating pregnancy, unspecified trimester: Secondary | ICD-10-CM

## 2012-12-03 DIAGNOSIS — IMO0002 Reserved for concepts with insufficient information to code with codable children: Secondary | ICD-10-CM

## 2012-12-03 DIAGNOSIS — Z1389 Encounter for screening for other disorder: Secondary | ICD-10-CM

## 2012-12-03 DIAGNOSIS — Z331 Pregnant state, incidental: Secondary | ICD-10-CM

## 2012-12-03 LAB — POCT URINALYSIS DIPSTICK
Blood, UA: NEGATIVE
Protein, UA: NEGATIVE

## 2012-12-03 NOTE — Progress Notes (Signed)
Patient reports good fetal movement, denies any bleeding and no rupture of membranes symptoms or contraction All questions answered.  Edema 1+, abdomen benign, reviewed post dates issues etc.

## 2012-12-03 NOTE — Patient Instructions (Signed)
Pregnancy - Third Trimester  The third trimester of pregnancy (the last 3 months) is a period of the most rapid growth for you and your baby. The baby approaches a length of 20 inches and a weight of 6 to 10 pounds. The baby is adding on fat and getting ready for life outside your body. While inside, babies have periods of sleeping and waking, suck their thumbs, and hiccups. You can often feel small contractions of the uterus. This is false labor. It is also called Braxton-Hicks contractions. This is like a practice for labor. The usual problems in this stage of pregnancy include more difficulty breathing, swelling of the hands and feet from water retention, and having to urinate more often because of the uterus and baby pressing on your bladder.   PRENATAL EXAMS  · Blood work may continue to be done during prenatal exams. These tests are done to check on your health and the probable health of your baby. Blood work is used to follow your blood levels (hemoglobin). Anemia (low hemoglobin) is common during pregnancy. Iron and vitamins are given to help prevent this. You may also continue to be checked for diabetes. Some of the past blood tests may be done again.  · The size of the uterus is measured during each visit. This makes sure your baby is growing properly according to your pregnancy dates.  · Your blood pressure is checked every prenatal visit. This is to make sure you are not getting toxemia.  · Your urine is checked every prenatal visit for infection, diabetes and protein.  · Your weight is checked at each visit. This is done to make sure gains are happening at the suggested rate and that you and your baby are growing normally.  · Sometimes, an ultrasound is performed to confirm the position and the proper growth and development of the baby. This is a test done that bounces harmless sound waves off the baby so your caregiver can more accurately determine due dates.  · Discuss the type of pain medication and  anesthesia you will have during your labor and delivery.  · Discuss the possibility and anesthesia if a Cesarean Section might be necessary.  · Inform your caregiver if there is any mental or physical violence at home.  Sometimes, a specialized non-stress test, contraction stress test and biophysical profile are done to make sure the baby is not having a problem. Checking the amniotic fluid surrounding the baby is called an amniocentesis. The amniotic fluid is removed by sticking a needle into the belly (abdomen). This is sometimes done near the end of pregnancy if an early delivery is required. In this case, it is done to help make sure the baby's lungs are mature enough for the baby to live outside of the womb. If the lungs are not mature and it is unsafe to deliver the baby, an injection of cortisone medication is given to the mother 1 to 2 days before the delivery. This helps the baby's lungs mature and makes it safer to deliver the baby.  CHANGES OCCURING IN THE THIRD TRIMESTER OF PREGNANCY  Your body goes through many changes during pregnancy. They vary from person to person. Talk to your caregiver about changes you notice and are concerned about.  · During the last trimester, you have probably had an increase in your appetite. It is normal to have cravings for certain foods. This varies from person to person and pregnancy to pregnancy.  · You may begin to   get stretch marks on your hips, abdomen, and breasts. These are normal changes in the body during pregnancy. There are no exercises or medications to take which prevent this change.  · Constipation may be treated with a stool softener or adding bulk to your diet. Drinking lots of fluids, fiber in vegetables, fruits, and whole grains are helpful.  · Exercising is also helpful. If you have been very active up until your pregnancy, most of these activities can be continued during your pregnancy. If you have been less active, it is helpful to start an exercise  program such as walking. Consult your caregiver before starting exercise programs.  · Avoid all smoking, alcohol, un-prescribed drugs, herbs and "street drugs" during your pregnancy. These chemicals affect the formation and growth of the baby. Avoid chemicals throughout the pregnancy to ensure the delivery of a healthy infant.  · Backache, varicose veins and hemorrhoids may develop or get worse.  · You will tire more easily in the third trimester, which is normal.  · The baby's movements may be stronger and more often.  · You may become short of breath easily.  · Your belly button may stick out.  · A yellow discharge may leak from your breasts called colostrum.  · You may have a bloody mucus discharge. This usually occurs a few days to a week before labor begins.  HOME CARE INSTRUCTIONS   · Keep your caregiver's appointments. Follow your caregiver's instructions regarding medication use, exercise, and diet.  · During pregnancy, you are providing food for you and your baby. Continue to eat regular, well-balanced meals. Choose foods such as meat, fish, milk and other low fat dairy products, vegetables, fruits, and whole-grain breads and cereals. Your caregiver will tell you of the ideal weight gain.  · A physical sexual relationship may be continued throughout pregnancy if there are no other problems such as early (premature) leaking of amniotic fluid from the membranes, vaginal bleeding, or belly (abdominal) pain.  · Exercise regularly if there are no restrictions. Check with your caregiver if you are unsure of the safety of your exercises. Greater weight gain will occur in the last 2 trimesters of pregnancy. Exercising helps:  · Control your weight.  · Get you in shape for labor and delivery.  · You lose weight after you deliver.  · Rest a lot with legs elevated, or as needed for leg cramps or low back pain.  · Wear a good support or jogging bra for breast tenderness during pregnancy. This may help if worn during  sleep. Pads or tissues may be used in the bra if you are leaking colostrum.  · Do not use hot tubs, steam rooms, or saunas.  · Wear your seat belt when driving. This protects you and your baby if you are in an accident.  · Avoid raw meat, cat litter boxes and soil used by cats. These carry germs that can cause birth defects in the baby.  · It is easier to loose urine during pregnancy. Tightening up and strengthening the pelvic muscles will help with this problem. You can practice stopping your urination while you are going to the bathroom. These are the same muscles you need to strengthen. It is also the muscles you would use if you were trying to stop from passing gas. You can practice tightening these muscles up 10 times a set and repeating this about 3 times per day. Once you know what muscles to tighten up, do not perform these   exercises during urination. It is more likely to cause an infection by backing up the urine.  · Ask for help if you have financial, counseling or nutritional needs during pregnancy. Your caregiver will be able to offer counseling for these needs as well as refer you for other special needs.  · Make a list of emergency phone numbers and have them available.  · Plan on getting help from family or friends when you go home from the hospital.  · Make a trial run to the hospital.  · Take prenatal classes with the father to understand, practice and ask questions about the labor and delivery.  · Prepare the baby's room/nursery.  · Do not travel out of the city unless it is absolutely necessary and with the advice of your caregiver.  · Wear only low or no heal shoes to have better balance and prevent falling.  MEDICATIONS AND DRUG USE IN PREGNANCY  · Take prenatal vitamins as directed. The vitamin should contain 1 milligram of folic acid. Keep all vitamins out of reach of children. Only a couple vitamins or tablets containing iron may be fatal to a baby or young child when ingested.  · Avoid use  of all medications, including herbs, over-the-counter medications, not prescribed or suggested by your caregiver. Only take over-the-counter or prescription medicines for pain, discomfort, or fever as directed by your caregiver. Do not use aspirin, ibuprofen (Motrin®, Advil®, Nuprin®) or naproxen (Aleve®) unless OK'd by your caregiver.  · Let your caregiver also know about herbs you may be using.  · Alcohol is related to a number of birth defects. This includes fetal alcohol syndrome. All alcohol, in any form, should be avoided completely. Smoking will cause low birth rate and premature babies.  · Street/illegal drugs are very harmful to the baby. They are absolutely forbidden. A baby born to an addicted mother will be addicted at birth. The baby will go through the same withdrawal an adult does.  SEEK MEDICAL CARE IF:  You have any concerns or worries during your pregnancy. It is better to call with your questions if you feel they cannot wait, rather than worry about them.  DECISIONS ABOUT CIRCUMCISION  You may or may not know the sex of your baby. If you know your baby is a boy, it may be time to think about circumcision. Circumcision is the removal of the foreskin of the penis. This is the skin that covers the sensitive end of the penis. There is no proven medical need for this. Often this decision is made on what is popular at the time or based upon religious beliefs and social issues. You can discuss these issues with your caregiver or pediatrician.  SEEK IMMEDIATE MEDICAL CARE IF:   · An unexplained oral temperature above 102° F (38.9° C) develops, or as your caregiver suggests.  · You have leaking of fluid from the vagina (birth canal). If leaking membranes are suspected, take your temperature and tell your caregiver of this when you call.  · There is vaginal spotting, bleeding or passing clots. Tell your caregiver of the amount and how many pads are used.  · You develop a bad smelling vaginal discharge with  a change in the color from clear to white.  · You develop vomiting that lasts more than 24 hours.  · You develop chills or fever.  · You develop shortness of breath.  · You develop burning on urination.  · You loose more than 2 pounds of weight   or gain more than 2 pounds of weight or as suggested by your caregiver.  · You notice sudden swelling of your face, hands, and feet or legs.  · You develop belly (abdominal) pain. Round ligament discomfort is a common non-cancerous (benign) cause of abdominal pain in pregnancy. Your caregiver still must evaluate you.  · You develop a severe headache that does not go away.  · You develop visual problems, blurred or double vision.  · If you have not felt your baby move for more than 1 hour. If you think the baby is not moving as much as usual, eat something with sugar in it and lie down on your left side for an hour. The baby should move at least 4 to 5 times per hour. Call right away if your baby moves less than that.  · You fall, are in a car accident or any kind of trauma.  · There is mental or physical violence at home.  Document Released: 08/21/2001 Document Revised: 11/19/2011 Document Reviewed: 02/23/2009  [employer]® Patient Information ©2013 [employer].

## 2012-12-04 NOTE — [facility] Provider Note (Signed)
Attestation of Attending Supervision of Advanced Practitioner (CNM/NP): Evaluation and management procedures were performed by the Advanced Practitioner under my supervision and collaboration. I have reviewed the Advanced Practitioner's note and chart, and I agree with the management and plan.  Rochell Puett H. 9:56 AM

## 2012-12-09 ENCOUNTER — Ambulatory Visit (INDEPENDENT_AMBULATORY_CARE_PROVIDER_SITE_OTHER): Payer: BC Managed Care – PPO | Admitting: Obstetrics & Gynecology

## 2012-12-09 VITALS — BP 140/80 | Wt 194.0 lb

## 2012-12-09 DIAGNOSIS — IMO0002 Reserved for concepts with insufficient information to code with codable children: Secondary | ICD-10-CM

## 2012-12-09 DIAGNOSIS — O99019 Anemia complicating pregnancy, unspecified trimester: Secondary | ICD-10-CM

## 2012-12-09 DIAGNOSIS — Z3403 Encounter for supervision of normal first pregnancy, third trimester: Secondary | ICD-10-CM

## 2012-12-09 DIAGNOSIS — Z331 Pregnant state, incidental: Secondary | ICD-10-CM

## 2012-12-09 DIAGNOSIS — Z1389 Encounter for screening for other disorder: Secondary | ICD-10-CM

## 2012-12-09 LAB — POCT URINALYSIS DIPSTICK
Blood, UA: NEGATIVE
Ketones, UA: NEGATIVE
Protein, UA: NEGATIVE

## 2012-12-09 NOTE — Progress Notes (Signed)
Patient reports good fetal movement, denies any bleeding and no rupture of membranes symptoms or contraction No complaints, all questions answered.  BP stable and negative protein.  No CNS sx.  Induction scheduled for nest week 12/16/2012.

## 2012-12-09 NOTE — Patient Instructions (Addendum)
Epidural Risks and Benefits The continuous putting in (infusion) of local anesthetics through a long, narrow, hollow plastic tube (catheter)/needle into the lower (lumbar) area of your spine is commonly called an epidural. This means outside the covering of the spinal cord. The epidural catheter is placed in the space on the outside of the membrane that covers the spinal cord. The anesthetic medicine numbs the nerves of the spinal cord in the epidural space. There is also a spinal/epidural anesthetic using two needles and a catheter. The medication is first placed in the spinal canal. Then that needle is removed and a catheter is placed in the epidural space through the second needle for continuous anesthesia. This seems to be the most popular type of regional anesthesia used now. This is sometimes given for pain management to women who are giving birth. Spinal and epidural anesthesia are called regional anesthesia because they numb a certain region of the body. While it is an effective pain management tool, some reasons not to use this include:  Restricted mobility: The tubes and monitors connected to you do not allow for much moving around.  Increased likelihood of bladder catheterization, oxytocin administration, and internal monitoring. This means a tube (catheter) may have to be put into the bladder to drain the urine. Uterine contractions can become weaker and less frequent. They also may have a higher use of oxytocin than mothers not having regional anesthesia.  Increased likelihood of operative delivery: This includes the use of or need for forceps, vacuum extractor, episiotomy, or cesarean delivery. When the dose is too large, or when it sinks down into the "tailbone" (sacral) region of the body, the perineum and the birth canal (vagina) are anesthetized. Anesthetic is injected into this area late in labor to deaden all sensation. When it "accidentally" happens earlier in labor, the muscles of the  pelvic floor are relaxed too early. This interferes with the normal flexion and rotation of the baby's head as it passes through the birth canal. This interference can lead to abnormal presentations that are more dangerous for the baby.  Must use an automatic blood pressure cuff throughout labor. This is a cuff that automatically takes your blood pressure at regular intervals. SHORT TERM MATERNAL RISKS  Dural puncture - The dura is one of the membranes surrounding the spinal cord. If the anesthetic medication gets into the spinal canal through a dural puncture, it can result in a spinal anesthetic and spinal headache. Spinal headaches are treated with an epidural blood patch to cover the punctured area.  Low blood pressure (hypotension) - Nearly one third of women with an epidural will develop low blood pressure. The ways that patients must lay during the epidural can make this worse. Their position is limited because they will be unable to move their legs easily for the time of the anesthetic. Low blood pressure is also a risk for the baby. If the baby does not get enough oxygen from the mom's blood, it can result in an emergency Cesarean section. This means the baby is delivered by an operation through a cut by the surgeon (incision) on the belly of the mother.  Nausea, vomiting, and prolonged shivering.  Prolonged labor - With large doses of anesthetic medication, the patient loses the desire and the ability to bear down and push. This results in an increased use of forceps and vacuum extractions, compared to women having unmedicated deliveries.  Uneven, incomplete or non-existent pain relief. Sometimes the epidural does not work well and   additional medications may be needed for pain relief.  Difficulty breathing well or paralysis if the level of anesthesia goes too high in the spine.  Convulsions - If the anesthetic agent accidentally is injected into a blood vessel it can cause convulsions and  loss of consciousness.  Toxic drug reactions.  Septic meningitis - An abscess can form at the site where the epidural catheter is placed. If this spreads into the spinal canal it can cause meningitis.  Allergic reaction - This causes blood pressure to become too low and other medications and fluids must be given to bring the blood pressure up. Also rashes and difficulty breathing may develop.  Cardiac arrest - This is rare but real threat to the life of the mother and baby.  Fever is common.  Itching that is easily treated.  Spinal hematoma. LONG TERM MATERNAL RISKS  Neurological complications - A nerve problem called Horner's syndrome can develop with epidural anesthesia for vaginal delivery. It is impossible to predict which patients will develop a Horner's syndrome. Even the nerves to the face can be blocked, temporarily or permanently. Tremors and shakes can occur.  Paresthesia ("pins and needles"). This is a feeling that comes from inflammation of a nerve.  Dizziness and fainting can become a problem after epidurals. This is usually only for a couple of days. RISKS TO BABY  Direct drug toxicity.  Fetal distress, abnormal fetal heart rate (FHR) (can lead to emergency cesarean). This is especially true if the anesthetic gets into the mother's blood stream or too much medication is put into the epidural. REASONS NOT TO HAVE EPIDURAL ANESTHESIA  Increased costs.  The mother has a low blood pressure.  There are blood clotting problems.  A brain tumor is present.  There is an infection in the blood stream.  A skin infection at the needle site.  A tattoo at the needle site. BENEFITS  Regional anesthesia is the most effective pain relief for labor and delivery.  It is the best anesthetic for preeclampsia and eclampsia.  There is better pain control after delivery (vaginal or cesarean).  When done correctly, no medication gets to the baby.  Sooner ambulation after  delivery.  It can be left in place during all of labor.  You can be awake during a Cesarean delivery and see the baby immediately after delivery. AFTER THE PROCEDURE   You will be kept in bed for several hours to prevent headaches.  You will be kept in bed until your legs are no longer numb and it is safe to walk.  The length of time you spend in the hospital will depend on the type of surgery or procedure you have had.  The epidural catheter is removed after you no longer need it for pain. HOME CARE INSTRUCTIONS   Do not drive or operate any kind of machinery for at least 24 hours. Make sure there is someone to drive you home.  Do not drink alcohol for at least 24 hours after the anesthesia.  Do not make important decisions for at least 24 hours after the anesthesia.  Drink lots of fluids.  Return to your normal diet.  Keep all your postoperative appointments as scheduled. SEEK IMMEDIATE MEDICAL CARE IF:  You develop a fever or temperature over 98.6 F (37 C).  You have a persistent headache.  You develop dizziness, fainting or lightheadedness.  You develop weakness, numbness or tingling in your arms or legs.  You have a skin rash.  You   have difficulty breathing  You have a stiff neck with or without stiff back.  You develop chest pain. Document Released: 08/27/2005 Document Revised: 11/19/2011 Document Reviewed: 10/04/2008 [employer] Patient Information 2013 [employer].  

## 2012-12-09 NOTE — Progress Notes (Signed)
Having vaginal pressure when she walk.

## 2012-12-10 ENCOUNTER — Encounter: Payer: Self-pay | Admitting: *Deleted

## 2012-12-10 ENCOUNTER — Telehealth (HOSPITAL_COMMUNITY): Payer: Self-pay | Admitting: *Deleted

## 2012-12-10 NOTE — Telephone Encounter (Signed)
Preadmission screen  

## 2012-12-12 ENCOUNTER — Encounter (HOSPITAL_COMMUNITY): Payer: Self-pay | Admitting: *Deleted

## 2012-12-12 ENCOUNTER — Telehealth (HOSPITAL_COMMUNITY): Payer: Self-pay | Admitting: *Deleted

## 2012-12-12 NOTE — Telephone Encounter (Signed)
Preadmission screen  

## 2012-12-16 ENCOUNTER — Encounter (HOSPITAL_COMMUNITY): Payer: Self-pay

## 2012-12-16 ENCOUNTER — Inpatient Hospital Stay (HOSPITAL_COMMUNITY): Payer: Medicaid Other | Admitting: Anesthesiology

## 2012-12-16 ENCOUNTER — Encounter (HOSPITAL_COMMUNITY): Payer: Self-pay | Admitting: Anesthesiology

## 2012-12-16 ENCOUNTER — Inpatient Hospital Stay (HOSPITAL_COMMUNITY)
Admission: RE | Admit: 2012-12-16 | Discharge: 2012-12-19 | DRG: 775 | Disposition: A | Discharge status: Home / Self Care | Payer: Medicaid Other | Source: Ambulatory Visit | Attending: Obstetrics and Gynecology | Admitting: Obstetrics and Gynecology

## 2012-12-16 VITALS — BP 123/74 | HR 86 | Temp 98.0°F | Resp 18 | Ht 62.0 in | Wt 194.0 lb

## 2012-12-16 DIAGNOSIS — Z3403 Encounter for supervision of normal first pregnancy, third trimester: Secondary | ICD-10-CM

## 2012-12-16 DIAGNOSIS — O480 Post-term pregnancy: Principal | ICD-10-CM | POA: Diagnosis present

## 2012-12-16 HISTORY — DX: Palpitations: R00.2

## 2012-12-16 LAB — TYPE AND SCREEN
ABO/RH(D): A POS
Antibody Screen: NEGATIVE

## 2012-12-16 LAB — CBC
Platelets: 385 10*3/uL (ref 150–400)
RDW: 15.1 % (ref 11.5–15.5)
WBC: 13 10*3/uL — ABNORMAL HIGH (ref 4.0–10.5)

## 2012-12-16 MED ORDER — DIPHENHYDRAMINE HCL 50 MG/ML IJ SOLN: 12.5000 mg | INTRAMUSCULAR | Status: DC | PRN

## 2012-12-16 MED ORDER — FENTANYL CITRATE 0.05 MG/ML IJ SOLN: 50.0000 ug | INTRAMUSCULAR | Status: DC | PRN

## 2012-12-16 MED ORDER — LACTATED RINGERS IV SOLN
500.0000 mL | Freq: Once | INTRAVENOUS | Status: AC
  Administered 2012-12-16: 500 mL via INTRAVENOUS

## 2012-12-16 MED ORDER — LACTATED RINGERS IV SOLN
500.0000 mL | INTRAVENOUS | Status: DC | PRN
  Administered 2012-12-16: 200 mL via INTRAVENOUS

## 2012-12-16 MED ORDER — PHENYLEPHRINE 40 MCG/ML (10ML) SYRINGE FOR IV PUSH (FOR BLOOD PRESSURE SUPPORT)
80.0000 ug | PREFILLED_SYRINGE | INTRAVENOUS | Status: DC | PRN
  Filled 2012-12-16: qty 2

## 2012-12-16 MED ORDER — OXYTOCIN 40 UNITS IN LACTATED RINGERS INFUSION - SIMPLE MED
62.5000 mL/h | INTRAVENOUS | Status: DC
  Administered 2012-12-17: 62.5 mL/h via INTRAVENOUS
  Filled 2012-12-16: qty 1000

## 2012-12-16 MED ORDER — OXYCODONE-ACETAMINOPHEN 5-325 MG PO TABS: 1.0000 | ORAL_TABLET | ORAL | Status: DC | PRN

## 2012-12-16 MED ORDER — LIDOCAINE HCL (PF) 1 % IJ SOLN
30.0000 mL | INTRAMUSCULAR | Status: DC | PRN
  Filled 2012-12-16 (×2): qty 30

## 2012-12-16 MED ORDER — EPHEDRINE 5 MG/ML INJ
10.0000 mg | INTRAVENOUS | Status: DC | PRN
  Filled 2012-12-16: qty 2

## 2012-12-16 MED ORDER — LIDOCAINE HCL (PF) 1 % IJ SOLN
INTRAMUSCULAR | Status: DC | PRN
  Administered 2012-12-16 (×3): 4 mL
  Administered 2012-12-16: 3 mL
  Administered 2012-12-16: 4 mL

## 2012-12-16 MED ORDER — ACETAMINOPHEN 325 MG PO TABS: 650.0000 mg | ORAL_TABLET | ORAL | Status: DC | PRN

## 2012-12-16 MED ORDER — OXYTOCIN BOLUS FROM INFUSION
500.0000 mL | INTRAVENOUS | Status: DC
  Administered 2012-12-17: 500 mL via INTRAVENOUS

## 2012-12-16 MED ORDER — PHENYLEPHRINE 40 MCG/ML (10ML) SYRINGE FOR IV PUSH (FOR BLOOD PRESSURE SUPPORT)
80.0000 ug | PREFILLED_SYRINGE | INTRAVENOUS | Status: DC | PRN
  Filled 2012-12-16: qty 5
  Filled 2012-12-16: qty 2

## 2012-12-16 MED ORDER — LACTATED RINGERS IV SOLN
INTRAVENOUS | Status: DC
  Administered 2012-12-16 – 2012-12-17 (×3): via INTRAVENOUS

## 2012-12-16 MED ORDER — ONDANSETRON HCL 4 MG/2ML IJ SOLN: 4.0000 mg | Freq: Four times a day (QID) | INTRAMUSCULAR | Status: DC | PRN

## 2012-12-16 MED ORDER — IBUPROFEN 600 MG PO TABS: 600.0000 mg | ORAL_TABLET | Freq: Four times a day (QID) | ORAL | Status: DC | PRN

## 2012-12-16 MED ORDER — FENTANYL 2.5 MCG/ML BUPIVACAINE 1/10 % EPIDURAL INFUSION (WH - ANES)
14.0000 mL/h | INTRAMUSCULAR | Status: DC | PRN
  Administered 2012-12-16 – 2012-12-17 (×2): 14 mL/h via EPIDURAL
  Filled 2012-12-16 (×2): qty 125

## 2012-12-16 MED ORDER — CITRIC ACID-SODIUM CITRATE 334-500 MG/5ML PO SOLN: 30.0000 mL | ORAL | Status: DC | PRN

## 2012-12-16 MED ORDER — EPHEDRINE 5 MG/ML INJ
10.0000 mg | INTRAVENOUS | Status: DC | PRN
  Filled 2012-12-16: qty 2
  Filled 2012-12-16: qty 4

## 2012-12-16 NOTE — Anesthesia Procedure Notes (Signed)
Epidural Patient location during procedure: OB Start time: 12/16/2012 8:08 PM  Staffing Performed by: anesthesiologist   Preanesthetic Checklist Completed: patient identified, site marked, surgical consent, pre-op evaluation, timeout performed, IV checked, risks and benefits discussed and monitors and equipment checked  Epidural Patient position: sitting Prep: site prepped and draped and DuraPrep Patient monitoring: continuous pulse ox and blood pressure Approach: midline Injection technique: LOR air  Needle:  Needle type: Tuohy  Needle gauge: 17 G Needle length: 9 cm and 9 Needle insertion depth: 5 cm cm Catheter type: closed end flexible Catheter size: 19 Gauge Catheter at skin depth: 10 cm Test dose: negative  Assessment Events: blood not aspirated, injection not painful, no injection resistance, negative IV test and no paresthesia  Additional Notes Discussed risk of headache, infection, bleeding, nerve injury and failed or incomplete block.  Patient voices understanding and wishes to proceed.  Epidural placed easily on first attempt.  No paresthesia.  Patient tolerated procedure well with no apparent complications.  Jasmine December, MDReason for block:procedure for pain

## 2012-12-16 NOTE — Anesthesia Preprocedure Evaluation (Signed)
Anesthesia Evaluation  Patient identified by MRN, date of birth, ID band Patient awake    Reviewed: Allergy & Precautions, H&P , NPO status , Patient's Chart, lab work & pertinent test results, reviewed documented beta blocker date and time   History of Anesthesia Complications Negative for: history of anesthetic complications  Airway Mallampati: III TM Distance: >3 FB Neck ROM: full  Mouth opening: Limited Mouth Opening  Dental  (+) Teeth Intact   Pulmonary neg pulmonary ROS,  breath sounds clear to auscultation        Cardiovascular hypertension (PIH), Rhythm:regular Rate:Normal     Neuro/Psych negative neurological ROS  negative psych ROS   GI/Hepatic negative GI ROS, Neg liver ROS,   Endo/Other  Obese - BMI 35.6  Renal/GU negative Renal ROS     Musculoskeletal   Abdominal   Peds  Hematology negative hematology ROS (+)   Anesthesia Other Findings   Reproductive/Obstetrics (+) Pregnancy                           Anesthesia Physical Anesthesia Plan  ASA: III  Anesthesia Plan: Epidural   Post-op Pain Management:    Induction:   Airway Management Planned:   Additional Equipment:   Intra-op Plan:   Post-operative Plan:   Informed Consent: I have reviewed the patients History and Physical, chart, labs and discussed the procedure including the risks, benefits and alternatives for the proposed anesthesia with the patient or authorized representative who has indicated his/her understanding and acceptance.     Plan Discussed with:   Anesthesia Plan Comments:         Anesthesia Quick Evaluation

## 2012-12-16 NOTE — H&P (Signed)
HPI: Heather Matthews is a 27 y.o. year old G73P0000 female at [redacted]w[redacted]d weeks gestation by 6.4 week [place] who presents for IOL for postdates in active labor, SROM at 1750. Received care at [facility]. Evaluated for gest HTN 12/01/12. Mildly elevated BP. Normal labs.   History OB History   Grav Para Term Preterm Abortions TAB SAB Ect Mult Living   1 0 0 0 0 0 0 0 0 0      Past Medical History  Diagnosis Date  . No pertinent past medical history   . Medical history non-contributory   . Abnormal Pap smear   . Anemia   . Pregnancy induced hypertension    Past Surgical History  Procedure Laterality Date  . No past surgeries     Family History: family history includes Diabetes in her other and Hypertension in her mother. Social History:  reports that she has never smoked. She does not have any smokeless tobacco history on file. She reports that she does not drink alcohol or use illicit drugs.   Prenatal Transfer Tool  Maternal Diabetes: No Genetic Screening: Normal Maternal Ultrasounds/Referrals: Normal Fetal Ultrasounds or other Referrals:  None Maternal Substance Abuse:  No Significant Maternal Medications:  None Significant Maternal Lab Results:  Lab values include: Group B Strep negative Other Comments:  None  Review of Systems  Constitutional: Negative for fever and chills.  Eyes: Negative for blurred vision.  Cardiovascular: Negative for chest pain and palpitations.  Gastrointestinal: Positive for abdominal pain (contractions).  Neurological: Negative for headaches.    Dilation: 3.5 Effacement (%): 90 Station: -2 Exam by:: Dr.McIntyre Blood pressure 114/56, pulse 86, temperature 98.9 F (37.2 C), temperature source Oral, resp. rate 20, height 5\' 2"  (1.575 m), weight 87.998 kg (194 lb), last menstrual period 02/19/2012, SpO2 100.00%. Maternal Exam:  Uterine Assessment: Contraction strength is firm.  Contraction frequency is regular.   Abdomen: Fundal height is S=D.   Fetal  presentation: vertex  Introitus: Amniotic fluid character: clear.  Pelvis: adequate for delivery.   Cervix: Cervix evaluated by digital exam.     Fetal Exam Fetal Monitor Review: Mode: ultrasound.   Baseline rate: 145.  Variability: moderate (6-25 bpm).   Pattern: accelerations present and no decelerations.    Fetal State Assessment: Category I - tracings are normal.     Physical Exam  Nursing note and vitals reviewed. Constitutional: She is oriented to person, place, and time. She appears well-developed and well-nourished. She appears distressed.  HENT:  Head: Normocephalic.  Eyes: Pupils are equal, round, and reactive to light.  Cardiovascular: Normal rate and regular rhythm.   Respiratory: Effort normal and breath sounds normal.  GI: Soft. There is no tenderness.  Genitourinary: Vagina normal.  Musculoskeletal: Normal range of motion. She exhibits edema. She exhibits no tenderness.  Neurological: She is alert and oriented to person, place, and time. She has normal reflexes.  Skin: Skin is warm and dry.  Psychiatric: She has a normal mood and affect.    Prenatal labs: ABO, Rh: --/--/A POS (04/08 1850) Antibody: PENDING (04/08 1850) Rubella: Immune (08/15 0000) RPR: Nonreactive (12/30 0000)  HBsAg: Negative (08/15 0000)  HIV: Non-reactive (12/30 0000)  GBS: Negative (03/10 0000)  Integrated screen normal 2 hour GTT normal  Assessment: 1. Labor: Active 2. Fetal Wellbeing: Category I  3. Pain Control: requesting epidural 4. GBS: Neg 5. 41.1 week IUP  Plan:  1. Admit to BS per consult with MD 2. Routine L&D orders 3. Analgesia/anesthesia PRN  Heather Matthews 12/16/2012, 8:36 PM

## 2012-12-17 ENCOUNTER — Encounter (HOSPITAL_COMMUNITY): Payer: Self-pay

## 2012-12-17 DIAGNOSIS — O480 Post-term pregnancy: Secondary | ICD-10-CM

## 2012-12-17 LAB — CBC
MCH: 26.6 pg (ref 26.0–34.0)
MCV: 82.3 fL (ref 78.0–100.0)
Platelets: 291 10*3/uL (ref 150–400)
RDW: 15.1 % (ref 11.5–15.5)
WBC: 21.3 10*3/uL — ABNORMAL HIGH (ref 4.0–10.5)

## 2012-12-17 LAB — RPR: RPR Ser Ql: NONREACTIVE

## 2012-12-17 MED ORDER — DIPHENHYDRAMINE HCL 25 MG PO CAPS: 25.0000 mg | ORAL_CAPSULE | Freq: Four times a day (QID) | ORAL | Status: DC | PRN

## 2012-12-17 MED ORDER — PRENATAL MULTIVITAMIN CH
1.0000 | ORAL_TABLET | Freq: Every day | ORAL | Status: DC
  Administered 2012-12-17 – 2012-12-18 (×2): 1 via ORAL
  Filled 2012-12-17 (×2): qty 1

## 2012-12-17 MED ORDER — LANOLIN HYDROUS EX OINT: TOPICAL_OINTMENT | CUTANEOUS | Status: DC | PRN

## 2012-12-17 MED ORDER — TERBUTALINE SULFATE 1 MG/ML IJ SOLN: 0.2500 mg | Freq: Once | INTRAMUSCULAR | Status: DC | PRN

## 2012-12-17 MED ORDER — ONDANSETRON HCL 4 MG PO TABS: 4.0000 mg | ORAL_TABLET | ORAL | Status: DC | PRN

## 2012-12-17 MED ORDER — ZOLPIDEM TARTRATE 5 MG PO TABS: 5.0000 mg | ORAL_TABLET | Freq: Every evening | ORAL | Status: DC | PRN

## 2012-12-17 MED ORDER — WITCH HAZEL-GLYCERIN EX PADS
1.0000 "application " | MEDICATED_PAD | CUTANEOUS | Status: DC | PRN
  Administered 2012-12-17: 1 via TOPICAL

## 2012-12-17 MED ORDER — ONDANSETRON HCL 4 MG/2ML IJ SOLN: 4.0000 mg | INTRAMUSCULAR | Status: DC | PRN

## 2012-12-17 MED ORDER — BENZOCAINE-MENTHOL 20-0.5 % EX AERO
1.0000 "application " | INHALATION_SPRAY | CUTANEOUS | Status: DC | PRN
  Administered 2012-12-17 – 2012-12-18 (×2): 1 via TOPICAL
  Filled 2012-12-17 (×2): qty 56

## 2012-12-17 MED ORDER — OXYCODONE-ACETAMINOPHEN 5-325 MG PO TABS
1.0000 | ORAL_TABLET | ORAL | Status: DC | PRN
  Administered 2012-12-17 – 2012-12-18 (×5): 1 via ORAL
  Administered 2012-12-19: 2 via ORAL
  Administered 2012-12-19: 1 via ORAL
  Filled 2012-12-17 (×5): qty 1
  Filled 2012-12-17: qty 2
  Filled 2012-12-17: qty 1

## 2012-12-17 MED ORDER — OXYTOCIN 40 UNITS IN LACTATED RINGERS INFUSION - SIMPLE MED
1.0000 m[IU]/min | INTRAVENOUS | Status: DC
  Administered 2012-12-17: 2 m[IU]/min via INTRAVENOUS

## 2012-12-17 MED ORDER — SIMETHICONE 80 MG PO CHEW: 80.0000 mg | CHEWABLE_TABLET | ORAL | Status: DC | PRN

## 2012-12-17 MED ORDER — DIBUCAINE 1 % RE OINT
1.0000 "application " | TOPICAL_OINTMENT | RECTAL | Status: DC | PRN
  Administered 2012-12-17: 1 via RECTAL
  Filled 2012-12-17: qty 28

## 2012-12-17 MED ORDER — IBUPROFEN 600 MG PO TABS
600.0000 mg | ORAL_TABLET | Freq: Four times a day (QID) | ORAL | Status: DC
  Administered 2012-12-17 – 2012-12-19 (×8): 600 mg via ORAL
  Filled 2012-12-17 (×8): qty 1

## 2012-12-17 MED ORDER — SENNOSIDES-DOCUSATE SODIUM 8.6-50 MG PO TABS
2.0000 | ORAL_TABLET | Freq: Every day | ORAL | Status: DC
  Administered 2012-12-17 – 2012-12-18 (×2): 2 via ORAL

## 2012-12-17 MED ORDER — TETANUS-DIPHTH-ACELL PERTUSSIS 5-2.5-18.5 LF-MCG/0.5 IM SUSP
0.5000 mL | Freq: Once | INTRAMUSCULAR | Status: AC
  Administered 2012-12-18: 0.5 mL via INTRAMUSCULAR
  Filled 2012-12-17: qty 0.5

## 2012-12-17 NOTE — Progress Notes (Signed)
Admission nutrition screen triggered. Patients chart reviewed and assessed  for nutritional risk. Patient is determined to be at low nutrition  risk.   Emmah Bratcher M.Ed. R.D. LDN Neonatal Nutrition Support Specialist Pager [phone]  

## 2012-12-17 NOTE — Anesthesia Postprocedure Evaluation (Signed)
  Anesthesia Post-op Note  Patient: Heather Matthews  Procedure(s) Performed: * No procedures listed *  Patient Location: PACU and Mother/Baby  Anesthesia Type:Epidural  Level of Consciousness: awake, alert , oriented and patient cooperative  Airway and Oxygen Therapy: Patient Spontanous Breathing  Post-op Pain: none  Post-op Assessment: Post-op Vital signs reviewed, Patient's Cardiovascular Status Stable and Respiratory Function Stable  Post-op Vital Signs: Reviewed and stable  Complications: No apparent anesthesia complications

## 2012-12-17 NOTE — Progress Notes (Signed)
UR chart review completed.  

## 2012-12-17 NOTE — Progress Notes (Signed)
Heather Matthews is a 27 y.o. G1P0000 at [redacted]w[redacted]d   Subjective: Pushed from 1-2:45a then rested until 4a. Now pushing more effectively x 1hr .  Objective: BP 129/78  Pulse 96  Temp(Src) 98.3 F (36.8 C) (Oral)  Resp 20  Ht 5\' 2"  (1.575 m)  Wt 194 lb (87.998 kg)  BMI 35.47 kg/m2  SpO2 100%  LMP 02/19/2012      FHT:  FHR: 135 bpm, variability: moderate,  accelerations:  Present,  decelerations:  Absent UC:   irregular, every 4-6 minutes SVE:   Dilation: 10 Effacement (%): 100 Station: +1 Exam by:: Merrell MD  large amount of vtx visible with 'cheating'; +2 plus  Labs: Lab Results  Component Value Date   WBC 13.0* 12/16/2012   HGB 11.7* 12/16/2012   HCT 36.1 12/16/2012   MCV 82.0 12/16/2012   PLT 385 12/16/2012    Assessment / Plan: Pushing x 3  Hrs now  Will add Pitocin to increase frequency/effectiveness of ctx Continue pushing Anticipate SVD   Heather Matthews 12/17/2012, 5:48 AM

## 2012-12-17 NOTE — Progress Notes (Addendum)
Heather Matthews is a 27 y.o. G1P0000 at [redacted]w[redacted]d admitted for active labor  Subjective: Doing well. Tried pushing for a while but became tired. Pain well controlled w/ epidural  Objective: BP 118/57  Pulse 100  Temp(Src) 98.1 F (36.7 C) (Oral)  Resp 20  Ht 5\' 2"  (1.575 m)  Wt 87.998 kg (194 lb)  BMI 35.47 kg/m2  SpO2 100%  LMP 02/19/2012      FHT:  FHR: 140-150s bpm, variability: moderate,  accelerations:  Present,  decelerations:  Present Early UC:   irregular, every 1-3 minutes SVE:   Dilation: 10 Effacement (%): 100 Station: +1 Exam by:: Nyasha Rahilly MD  Labs: Lab Results  Component Value Date   WBC 13.0* 12/16/2012   HGB 11.7* 12/16/2012   HCT 36.1 12/16/2012   MCV 82.0 12/16/2012   PLT 385 12/16/2012    Assessment / Plan: Spontaneous labor, progressing normally  Labor: Progressing normally Fetal Wellbeing:  Category I Pain Control:  Epidural I/D:  GBS Neg Anticipated MOD:  NSVD Will allow pt to rest and labor down.   [name], MD Family Medicine Resident PGY2 12/17/2012, 2:48 AM

## 2012-12-17 NOTE — Progress Notes (Signed)
Heather Matthews is a 27 y.o. G1P0000 at [redacted]w[redacted]d admitted for active labor  Subjective: Feels well. Pain well controlled on epidural. No urge to push  Objective: BP 135/67  Pulse 95  Temp(Src) 98.1 F (36.7 C) (Oral)  Resp 20  Ht 5\' 2"  (1.575 m)  Wt 87.998 kg (194 lb)  BMI 35.47 kg/m2  SpO2 100%  LMP 02/19/2012      FHT:  FHR: 130s bpm, variability: moderate,  accelerations:  Present,  decelerations:  Absent UC:   regular, every 1-2 minutes SVE:   Dilation: 10 Effacement (%): 100 Station: +1 Exam by:: Garrie Elenes MD  Labs: Lab Results  Component Value Date   WBC 13.0* 12/16/2012   HGB 11.7* 12/16/2012   HCT 36.1 12/16/2012   MCV 82.0 12/16/2012   PLT 385 12/16/2012    Assessment / Plan: Spontaneous labor, progressing normally  Labor: Progressing normally Fetal Wellbeing:  Category I Pain Control:  Epidural I/D:  GBS neg Anticipated MOD:  NSVD  Lesette Frary, MD Family Medicine Resident PGY-2 12/17/2012, 12:46 AM

## 2012-12-17 NOTE — H&P (Signed)
Attestation of Attending Supervision of Advanced Practitioner (CNM/NP): Evaluation and management procedures were performed by the Advanced Practitioner under my supervision and collaboration.  I have reviewed the Advanced Practitioner's note and chart, and I agree with the management and plan.  Terissa Haffey 12/17/2012 8:13 AM   

## 2012-12-18 LAB — CBC
HCT: 32.4 % — ABNORMAL LOW (ref 36.0–46.0)
MCH: 26.7 pg (ref 26.0–34.0)
MCHC: 31.8 g/dL (ref 30.0–36.0)
MCV: 83.9 fL (ref 78.0–100.0)
WBC: 13.8 10*3/uL — ABNORMAL HIGH (ref 4.0–10.5)

## 2012-12-18 NOTE — Progress Notes (Signed)
Post Partum Day 1  Subjective: no complaints, up ad lib, voiding and tolerating PO  Objective: Blood pressure 106/64, pulse 73, temperature 97.6 F (36.4 C), temperature source Oral, resp. rate 20, height 5\' 2"  (1.575 m), weight 87.998 kg (194 lb), last menstrual period 02/19/2012, SpO2 96.00%, unknown if currently breastfeeding.  Physical Exam:  General: alert, cooperative and appears stated age Lochia: appropriate Uterine Fundus: firm DVT Evaluation: No evidence of DVT seen on physical exam.   Recent Labs  12/17/12 0720 12/18/12 0604  HGB 10.7* 10.3*  HCT 33.1* 32.4*    Assessment/Plan: Plan for discharge tomorrow and Contraception undecided (OCP vs depo)   LOS: 2 days   Harmonii Karle, MD Family Medicine Resident PGY2 12/18/2012, 7:39 AM

## 2012-12-18 NOTE — Progress Notes (Signed)
I have seen the patient with the resident/student and agree with the above.  Makisha Marrin Donovan  

## 2012-12-19 MED ORDER — IBUPROFEN 600 MG PO TABS: 600.0000 mg | ORAL_TABLET | Freq: Four times a day (QID) | ORAL | Status: DC | PRN

## 2012-12-19 NOTE — Discharge Summary (Signed)
Obstetric Discharge Summary Reason for Admission: induction of labor and postdates Prenatal Procedures: ultrasound Intrapartum Procedures: vacuum Postpartum Procedures: none Complications-Operative and Postpartum: none Pt pushed for a total of about 5 hours, vacuum was used for maternal exhaustion.  Vagina intact Hemoglobin  Date Value Range Status  12/18/2012 10.3* 12.0 - 15.0 g/dL Final     HCT  Date Value Range Status  12/18/2012 32.4* 36.0 - 46.0 % Final    Physical Exam:  General: alert, cooperative and no distress Lochia: appropriate Uterine Fundus: firm DVT Evaluation: No evidence of DVT seen on physical exam.  Discharge Diagnoses: Term Pregnancy-delivered  Discharge Information: Date: 12/19/2012 Activity: pelvic rest Diet: routine Medications: None Condition: stable Instructions: refer to practice specific booklet Discharge to: home Follow-up Information   Follow up with [facility]. (Keep appt scheduled for 6 weeeks)    Contact information:   [address] [place] [address] [phone]      Newborn Data: Live born female  Birth Weight: 7 lb 6.9 oz (3370 g) APGAR: 8, 9  Home with mother.  CRESENZO-DISHMAN,Howell Groesbeck 12/19/2012, 7:33 AM

## 2013-01-20 ENCOUNTER — Encounter: Payer: Self-pay | Admitting: Obstetrics & Gynecology

## 2013-01-20 ENCOUNTER — Ambulatory Visit (INDEPENDENT_AMBULATORY_CARE_PROVIDER_SITE_OTHER): Payer: BC Managed Care – PPO | Admitting: Obstetrics & Gynecology

## 2013-01-20 NOTE — Patient Instructions (Signed)
Levonorgestrel intrauterine device (IUD) What is this medicine? LEVONORGESTREL IUD (LEE voe nor jes trel) is a contraceptive (birth control) device. The device is placed inside the uterus by a healthcare professional. It is used to prevent pregnancy and can also be used to treat heavy bleeding that occurs during your period. Depending on the device, it can be used for 3 to 5 years. This medicine may be used for other purposes; ask your health care provider or pharmacist if you have questions. What should I tell my health care provider before I take this medicine? They need to know if you have any of these conditions: -abnormal Pap smear -cancer of the breast, uterus, or cervix -diabetes -endometritis -genital or pelvic infection now or in the past -have more than one sexual partner or your partner has more than one partner -heart disease -history of an ectopic or tubal pregnancy -immune system problems -IUD in place -liver disease or tumor -problems with blood clots or take blood-thinners -use intravenous drugs -uterus of unusual shape -vaginal bleeding that has not been explained -an unusual or allergic reaction to levonorgestrel, other hormones, silicone, or polyethylene, medicines, foods, dyes, or preservatives -pregnant or trying to get pregnant -breast-feeding How should I use this medicine? This device is placed inside the uterus by a health care professional. Talk to your pediatrician regarding the use of this medicine in children. Special care may be needed. Overdosage: If you think you have taken too much of this medicine contact a poison control center or emergency room at once. NOTE: This medicine is only for you. Do not share this medicine with others. What if I miss a dose? This does not apply. What may interact with this medicine? Do not take this medicine with any of the following medications: -amprenavir -bosentan -fosamprenavir This medicine may also interact with  the following medications: -aprepitant -barbiturate medicines for inducing sleep or treating seizures -bexarotene -griseofulvin -medicines to treat seizures like carbamazepine, ethotoin, felbamate, oxcarbazepine, phenytoin, topiramate -modafinil -pioglitazone -rifabutin -rifampin -rifapentine -some medicines to treat HIV infection like atazanavir, indinavir, lopinavir, nelfinavir, tipranavir, ritonavir -St. John's wort -warfarin This list may not describe all possible interactions. Give your health care provider a list of all the medicines, herbs, non-prescription drugs, or dietary supplements you use. Also tell them if you smoke, drink alcohol, or use illegal drugs. Some items may interact with your medicine. What should I watch for while using this medicine? Visit your doctor or health care professional for regular check ups. See your doctor if you or your partner has sexual contact with others, becomes HIV positive, or gets a sexual transmitted disease. This product does not protect you against HIV infection (AIDS) or other sexually transmitted diseases. You can check the placement of the IUD yourself by reaching up to the top of your vagina with clean fingers to feel the threads. Do not pull on the threads. It is a good habit to check placement after each menstrual period. Call your doctor right away if you feel more of the IUD than just the threads or if you cannot feel the threads at all. The IUD may come out by itself. You may become pregnant if the device comes out. If you notice that the IUD has come out use a backup birth control method like condoms and call your health care provider. Using tampons will not change the position of the IUD and are okay to use during your period. What side effects may I notice from receiving this medicine?   Side effects that you should report to your doctor or health care professional as soon as possible: -allergic reactions like skin rash, itching or  hives, swelling of the face, lips, or tongue -fever, flu-like symptoms -genital sores -high blood pressure -no menstrual period for 6 weeks during use -pain, swelling, warmth in the leg -pelvic pain or tenderness -severe or sudden headache -signs of pregnancy -stomach cramping -sudden shortness of breath -trouble with balance, talking, or walking -unusual vaginal bleeding, discharge -yellowing of the eyes or skin Side effects that usually do not require medical attention (report to your doctor or health care professional if they continue or are bothersome): -acne -breast pain -change in sex drive or performance -changes in weight -cramping, dizziness, or faintness while the device is being inserted -headache -irregular menstrual bleeding within first 3 to 6 months of use -nausea This list may not describe all possible side effects. Call your doctor for medical advice about side effects. You may report side effects to FDA at 1-800-FDA-1088. Where should I keep my medicine? This does not apply. NOTE: This sheet is a summary. It may not cover all possible information. If you have questions about this medicine, talk to your doctor, pharmacist, or health care provider.  2013, [employer]vier/Gold Standard. (09/27/2011 1:54:04 PM)  

## 2013-01-20 NOTE — Progress Notes (Signed)
Patient ID: Heather Matthews, female   DOB: 10-30-1985, 27 y.o.   MRN: [id number] Subjective:     Heather Matthews is a 27 y.o. female who presents for a postpartum visit. She is 5 weeks postpartum following a spontaneous vaginal delivery. I have fully reviewed the prenatal and intrapartum course. The delivery was at 41 gestational weeks. Outcome: spontaneous vaginal delivery. Anesthesia: epidural. Postpartum course has been unremarkable. Baby's course has been normal. Baby is feeding by bottle - [employer] Good Start. Bleeding no bleeding. Bowel function is normal. Bladder function is normal. Patient is sexually active. Contraception method is none. Postpartum depression screening: negative.  The following portions of the patient's history were reviewed and updated as appropriate: allergies, current medications, past family history, past medical history, past social history, past surgical history and problem list.  Review of Systems Pertinent items are noted in HPI.   Objective:    BP 128/88  Ht 5\' 2"  (1.575 m)  Wt 168 lb 8 oz (76.431 kg)  BMI 30.81 kg/m2  Breastfeeding? No  Normal pelvic exam with normal involution, normal size OK for Mirena Assessment:     normal postpartum exam. Pap smear not done at today's visit.   Plan:    1. Contraception: IUD 2. Mirena placement at menses 3. Follow up in: 1 prn or as needed.

## 2013-02-12 ENCOUNTER — Ambulatory Visit (INDEPENDENT_AMBULATORY_CARE_PROVIDER_SITE_OTHER): Payer: BC Managed Care – PPO | Admitting: Advanced Practice Midwife

## 2013-02-12 ENCOUNTER — Encounter: Payer: Self-pay | Admitting: Advanced Practice Midwife

## 2013-02-12 VITALS — BP 140/82 | Ht 62.0 in | Wt 166.0 lb

## 2013-02-12 DIAGNOSIS — Z30430 Encounter for insertion of intrauterine contraceptive device: Secondary | ICD-10-CM

## 2013-02-12 DIAGNOSIS — Z3202 Encounter for pregnancy test, result negative: Secondary | ICD-10-CM

## 2013-02-12 NOTE — Progress Notes (Signed)
Heather Matthews is a 27 y.o. year old Caucasian female Gravida 1 Para 1  who presents for placement of a Mirena IUD.  The risks and benefits of the method and placement have been thouroughly reviewed with the patient and all questions were answered.  Specifically the patient is aware of failure rate of 09/998, expulsion of the IUD and of possible perforation.  The patient is aware of irregular bleeding due to the method and understands the incidence of irregular bleeding diminishes with time.  Time out was performed.  A Graves speculum was placed.  The cervix was prepped using Betadine. The uterus was found to be retroverted and it sounded to 6 cm.  The cervix was grasped with a tenaculum and the IUD was inserted to 6 cm.  It was pulled back 1 cm and the IUD was disengaged.  The strings were trimmed to 3 cm.   The patient was instructed on signs and symptoms of infection and to check for the strings after each menses or each month.  The patient is to refrain from intercourse for 3 days.  The patient is scheduled for a return appointment after her first menses or 4 weeks.  CRESENZO-DISHMAN,Gyselle Matthew 02/12/2013 11:03 AM

## 2013-03-17 ENCOUNTER — Ambulatory Visit: Payer: BC Managed Care – PPO | Admitting: Advanced Practice Midwife

## 2013-07-16 ENCOUNTER — Other Ambulatory Visit: Payer: Self-pay

## 2014-07-12 ENCOUNTER — Encounter: Payer: Self-pay | Admitting: Advanced Practice Midwife

## 2019-12-22 ENCOUNTER — Telehealth: Payer: Self-pay | Admitting: Advanced Practice Midwife

## 2019-12-22 NOTE — Telephone Encounter (Signed)
Patient called, scheduled an IUD removal/insertion.  She is self pay and would like to know the cost.  I saw it broken down on the fees, but nothing combined.

## 2020-01-25 ENCOUNTER — Other Ambulatory Visit: Payer: Self-pay

## 2020-01-25 ENCOUNTER — Encounter: Payer: Self-pay | Admitting: Advanced Practice Midwife

## 2020-01-25 ENCOUNTER — Ambulatory Visit: Payer: Self-pay | Admitting: Advanced Practice Midwife

## 2020-01-25 VITALS — BP 148/89 | HR 101 | Ht 62.0 in | Wt 178.0 lb

## 2020-01-25 DIAGNOSIS — Z30430 Encounter for insertion of intrauterine contraceptive device: Secondary | ICD-10-CM

## 2020-01-25 DIAGNOSIS — Z3202 Encounter for pregnancy test, result negative: Secondary | ICD-10-CM

## 2020-01-25 DIAGNOSIS — Z30433 Encounter for removal and reinsertion of intrauterine contraceptive device: Secondary | ICD-10-CM

## 2020-01-25 LAB — POCT URINE PREGNANCY: Preg Test, Ur: NEGATIVE

## 2020-01-25 MED ORDER — LEVONORGESTREL 20 MCG/24HR IU IUD: INTRAUTERINE_SYSTEM | Freq: Once | INTRAUTERINE | Status: AC

## 2020-01-25 NOTE — Progress Notes (Signed)
Heather Matthews is a 34 y.o. year old Caucasian female   who presents for removal and replacement of her Mirena IUD. The new IUD is Mirena It has been 7  years since her previous IUD placement.She has been amenorrheic this whole time. The risks and benefits of the method and placement have been thouroughly reviewed with the patient and all questions were answered.  Specifically the patient is aware of failure rate of 09/998, expulsion of the IUD and of possible perforation.  The patient is aware of irregular bleeding due to the method and understands the incidence of irregular bleeding diminishes with time.  Time out was performed.  A Graves speculum was placed.  The cervix was prepped using Betadine. The strings were found to be not visible.  A pair of closed Kelly's were inserted just inside the os and the strings were immediately grasped and the IUD was easily removed. The cervix was then grasped with a tenaculum and the uterus was sounded to 8 cm. The IUD was inserted to 8 cm.  It was pulled back 1 cm and the IUD was disengaged. After 15 seconds, the IUD was placed in the fundus.  The strings were trimmed to 3 cm.  Sonogram was performed and the proper placement of the IUD was verified.  The patient was instructed on signs and symptoms of infection and to check for the strings after each menses or each month.  The patient is to refrain from intercourse for 3 days.

## 2020-01-25 NOTE — Addendum Note (Signed)
Addended by: Moss Mc on: 01/25/2020 05:02 PM   Modules accepted: Orders

## 2020-01-25 NOTE — Patient Instructions (Signed)
IUD PLACEMENT POST-PROCEDURE INSTRUCTIONS  1. You may take Ibuprofen, Aleve or Tylenol for pain if needed.  Cramping should resolve within in 24 hours.  2. You may have a small amount of spotting.  You should wear a mini pad for the next few days  3.    Nothing in the vagina for 3 days (tampons or intercourse)  4.  You need to call if you have a fever (more than 100.4), any moderate to severe pelvic pain, fever, or foul smelling vaginal discharge.  Irregular bleeding is common the first several months after having an IUD placed. You do not need to call for this reason unless you are concerned.  5. Shower or bathe as normal  6.  You should have a follow-up appointment in 4-8 weeks for a re-check to make sure you are not having any problems.  

## 2022-08-21 DIAGNOSIS — G510 Bell's palsy: Secondary | ICD-10-CM | POA: Diagnosis not present

## 2022-09-13 ENCOUNTER — Other Ambulatory Visit: Payer: Self-pay

## 2022-09-13 ENCOUNTER — Ambulatory Visit
Admission: RE | Admit: 2022-09-13 | Discharge: 2022-09-13 | Disposition: A | Discharge status: Home / Self Care | Payer: Medicaid Other | Source: Ambulatory Visit

## 2022-09-13 VITALS — BP 135/84 | HR 92 | Temp 98.1°F | Resp 16

## 2022-09-13 DIAGNOSIS — R6889 Other general symptoms and signs: Secondary | ICD-10-CM | POA: Diagnosis not present

## 2022-09-13 DIAGNOSIS — J069 Acute upper respiratory infection, unspecified: Secondary | ICD-10-CM | POA: Diagnosis not present

## 2022-09-13 MED ORDER — OSELTAMIVIR PHOSPHATE 75 MG PO CAPS: 75.0000 mg | ORAL_CAPSULE | Freq: Two times a day (BID) | ORAL | 0 refills | Status: AC

## 2022-09-13 MED ORDER — BENZONATATE 100 MG PO CAPS: 100.0000 mg | ORAL_CAPSULE | Freq: Three times a day (TID) | ORAL | 0 refills | Status: DC | PRN

## 2022-09-13 MED ORDER — PROMETHAZINE-DM 6.25-15 MG/5ML PO SYRP: 5.0000 mL | ORAL_SOLUTION | Freq: Every evening | ORAL | 0 refills | Status: DC | PRN

## 2022-09-13 NOTE — ED Provider Notes (Signed)
[facility]    CSN: [id number] Arrival date & time: 09/13/22  1400      History   Chief Complaint Chief Complaint  Patient presents with   Cough    Cough with headache, chest and back pain when cough, weakness, and dizziness. - Entered by patient    HPI Heather Matthews is a 37 y.o. female.   Patient presents today for 2-day history of bodyaches, chills, fever at home, congested cough, chest pain after coughing, chest and nasal congestion, runny nose, sore throat from coughing, headache, decreased appetite, and fatigue.  Reports she feels terrible.  She felt similarly when she had the flu a few years ago.  She denies shortness of breath, ear pain, abdominal pain, nausea, diarrhea.  Reports her daughter had similar symptoms last week, was seen in urgent care and tested negative for COVID-19.  Has been taking Tylenol, ibuprofen, Robitussin, and Mucinex DM without much benefit.  Reports that she has been recovering from Bell's palsy for the past 15 weeks.    Past Medical History:  Diagnosis Date   Abnormal Pap smear    Anemia    Medical history non-contributory    No pertinent past medical history    Palpitations    no current issues   Pregnancy induced hypertension     Patient Active Problem List   Diagnosis Date Noted   Encounter for IUD removal and reinsertion 01/25/2020    Past Surgical History:  Procedure Laterality Date   NO PAST SURGERIES      OB History     Gravida  1   Para  1   Term  1   Preterm  0   AB  0   Living  1      SAB  0   IAB  0   Ectopic  0   Multiple  0   Live Births  1            Home Medications    Prior to Admission medications   Medication Sig Start Date End Date Taking? Authorizing Provider  benzonatate (TESSALON) 100 MG capsule Take 1 capsule (100 mg total) by mouth 3 (three) times daily as needed for cough. Do not take with alcohol or while driving or operating heavy machinery. May cause drowsiness.  09/13/22  Yes Valentino Nose, NP  levocetirizine (XYZAL) 2.5 MG/5ML solution Take 2.5 mg by mouth every evening.   Yes [provider]  oseltamivir (TAMIFLU) 75 MG capsule Take 1 capsule (75 mg total) by mouth 2 (two) times daily for 5 days. 09/13/22 09/18/22 Yes Valentino Nose, NP  promethazine-dextromethorphan (PROMETHAZINE-DM) 6.25-15 MG/5ML syrup Take 5 mLs by mouth at bedtime as needed for cough. Do not take with alcohol or while driving or operating heavy machinery.  May cause drowsiness. 09/13/22  Yes Cathlean Marseilles A, NP  Ascorbic Acid (VITAMIN C GUMMIE PO) Take by mouth.    [provider]  Ascorbic Acid (VITAMIN C) 100 MG tablet Take 100 mg by mouth daily.    [provider]  calcium carbonate (TUMS - DOSED IN MG ELEMENTAL CALCIUM) 500 MG chewable tablet Chew 3 tablets by mouth 3 (three) times daily as needed for heartburn.    [provider]  cetirizine (ZYRTEC) 10 MG tablet Take 10 mg by mouth daily.    [provider]  flintstones complete (FLINTSTONES) 60 MG chewable tablet Chew 2 tablets by mouth daily.    [provider]  ibuprofen (ADVIL,MOTRIN) 600 MG tablet Take 1 tablet (600 mg total) by mouth every 6 (six) hours as needed for pain. 12/19/12   Leeanne Rio, MD  phentermine (ADIPEX-P) 37.5 MG tablet Take 37.5 mg by mouth daily.    [provider]    Family History Family History  Problem Relation Age of Onset   Diabetes Other    Hypertension Mother     Social History Social History   Tobacco Use   Smoking status: Never   Smokeless tobacco: Never  Substance Use Topics   Alcohol use: Not Currently    Comment: occ wine   Drug use: No     Allergies   Patient has no known allergies.   Review of Systems Review of Systems Per HPI  Physical Exam Triage Vital Signs ED Triage Vitals  Enc Vitals Group     BP 09/13/22 1454 135/84     Pulse Rate 09/13/22 1454 92     Resp 09/13/22 1454 16      Temp 09/13/22 1454 98.1 F (36.7 C)     Temp Source 09/13/22 1454 Oral     SpO2 09/13/22 1454 98 %     Weight --      Height --      Head Circumference --      Peak Flow --      Pain Score 09/13/22 1457 3     Pain Loc --      Pain Edu? --      Excl. in [place]? --    No data found.  Updated Vital Signs BP 135/84 (BP Location: Right Arm)   Pulse 92   Temp 98.1 F (36.7 C) (Oral)   Resp 16   SpO2 98%   Visual Acuity Right Eye Distance:   Left Eye Distance:   Bilateral Distance:    Right Eye Near:   Left Eye Near:    Bilateral Near:     Physical Exam Vitals and nursing note reviewed.  Constitutional:      General: She is not in acute distress.    Appearance: Normal appearance. She is not ill-appearing or toxic-appearing.  HENT:     Head: Normocephalic and atraumatic.     Right Ear: Tympanic membrane, ear canal and external ear normal.     Left Ear: Tympanic membrane, ear canal and external ear normal.     Nose: Congestion and rhinorrhea present.     Mouth/Throat:     Mouth: Mucous membranes are moist.     Pharynx: Oropharynx is clear. Posterior oropharyngeal erythema present. No oropharyngeal exudate.  Eyes:     General: No scleral icterus.    Extraocular Movements: Extraocular movements intact.  Cardiovascular:     Rate and Rhythm: Normal rate and regular rhythm.  Pulmonary:     Effort: Pulmonary effort is normal. No respiratory distress.     Breath sounds: Normal breath sounds. No wheezing, rhonchi or rales.  Abdominal:     General: Abdomen is flat. Bowel sounds are normal. There is no distension.     Palpations: Abdomen is soft.  Musculoskeletal:     Cervical back: Normal range of motion and neck supple.  Lymphadenopathy:     Cervical: No cervical adenopathy.  Skin:    General: Skin is warm and dry.     Coloration: Skin is not jaundiced or pale.     Findings: No erythema or rash.  Neurological:     Mental Status: She is alert and oriented  to person,  place, and time.  Psychiatric:        Behavior: Behavior is cooperative.      UC Treatments / Results  Labs (all labs ordered are listed, but only abnormal results are displayed) Labs Reviewed - No data to display  EKG   Radiology No results found.  Procedures Procedures (including critical care time)  Medications Ordered in UC Medications - No data to display  Initial Impression / Assessment and Plan / UC Course  I have reviewed the triage vital signs and the nursing notes.  Pertinent labs & imaging results that were available during my care of the patient were reviewed by me and considered in my medical decision making (see chart for details).   Patient is well-appearing, normotensive, afebrile, not tachycardic, not tachypneic, oxygenating well on room air.    Flu-like symptoms Viral URI with cough Suspect viral etiology, I am suspicious for influenza Treat empirically with Tamiflu twice daily for 5 days Supportive care discussed Start cough suppressant medication ER and return precautions discussed  The patient was given the opportunity to ask questions.  All questions answered to their satisfaction.  The patient is in agreement to this plan.    Final Clinical Impressions(s) / UC Diagnoses   Final diagnoses:  Flu-like symptoms  Viral URI with cough     Discharge Instructions      You have a viral upper respiratory infection.  I am concerned you may have influenza.  Please start the Tamiflu to treat this.  Symptoms should improve over the next week to 10 days.  If you develop chest pain or shortness of breath, go to the emergency room.  Some things that can make you feel better are: - Increased rest - Increasing fluid with water/sugar free electrolytes - Acetaminophen and ibuprofen as needed for fever/pain - Salt water gargling, chloraseptic spray and throat lozenges for sore throat - OTC guaifenesin (Mucinex) 600 mg twice daily for congestion - Saline  sinus flushes or a neti pot for congestion - Humidifying the air -Tessalon Perles during the day as needed for dry cough and cough syrup at nighttime as needed for dry cough\     ED Prescriptions     Medication Sig Dispense Auth. Provider   benzonatate (TESSALON) 100 MG capsule Take 1 capsule (100 mg total) by mouth 3 (three) times daily as needed for cough. Do not take with alcohol or while driving or operating heavy machinery. May cause drowsiness. 21 capsule Noemi Chapel A, NP   promethazine-dextromethorphan (PROMETHAZINE-DM) 6.25-15 MG/5ML syrup Take 5 mLs by mouth at bedtime as needed for cough. Do not take with alcohol or while driving or operating heavy machinery.  May cause drowsiness. 118 mL Noemi Chapel A, NP   oseltamivir (TAMIFLU) 75 MG capsule Take 1 capsule (75 mg total) by mouth 2 (two) times daily for 5 days. 10 capsule Eulogio Bear, NP      PDMP not reviewed this encounter.   Eulogio Bear, NP 09/13/22 1527

## 2022-09-13 NOTE — Discharge Instructions (Signed)
You have a viral upper respiratory infection.  I am concerned you may have influenza.  Please start the Tamiflu to treat this.  Symptoms should improve over the next week to 10 days.  If you develop chest pain or shortness of breath, go to the emergency room.  Some things that can make you feel better are: - Increased rest - Increasing fluid with water/sugar free electrolytes - Acetaminophen and ibuprofen as needed for fever/pain - Salt water gargling, chloraseptic spray and throat lozenges for sore throat - OTC guaifenesin (Mucinex) 600 mg twice daily for congestion - Saline sinus flushes or a neti pot for congestion - Humidifying the air -Tessalon Perles during the day as needed for dry cough and cough syrup at nighttime as needed for dry cough\

## 2022-09-13 NOTE — ED Triage Notes (Signed)
Pt reports cough, fever, body aches, intermittent emesis x3 days.

## 2022-09-20 ENCOUNTER — Telehealth: Payer: Medicaid Other | Admitting: Emergency Medicine

## 2022-09-20 DIAGNOSIS — J019 Acute sinusitis, unspecified: Secondary | ICD-10-CM

## 2022-09-20 DIAGNOSIS — B9689 Other specified bacterial agents as the cause of diseases classified elsewhere: Secondary | ICD-10-CM | POA: Diagnosis not present

## 2022-09-20 MED ORDER — AMOXICILLIN-POT CLAVULANATE 875-125 MG PO TABS: 1.0000 | ORAL_TABLET | Freq: Two times a day (BID) | ORAL | 0 refills | Status: DC

## 2022-09-20 NOTE — Progress Notes (Signed)
E-Visit for Sinus Problems  We are sorry that you are not feeling well.  Here is how we plan to help!  Based on what you have shared with me it looks like you have sinusitis.  Sinusitis is inflammation and infection in the sinus cavities of the head.  Based on your presentation I believe you most likely have Acute Bacterial Sinusitis.  This is an infection caused by bacteria and is treated with antibiotics. I have prescribed Augmentin 875mg /125mg  one tablet twice daily with food, for 7 days.   You may use an oral decongestant such as Mucinex D or if you have glaucoma or high blood pressure use plain Mucinex.   Saline nasal spray help and can safely be used as often as needed for congestion.  Try using saline irrigation, such as with a neti pot, several times a day while you are sick. Many neti pots come with salt packets premeasured to use to make saline. If you use your own salt, make sure it is kosher salt or sea salt (don't use table salt as it has iodine in it and you don't need that in your nose). Use distilled water to make saline. If you mix your own saline using your own salt, the recipe is 1/4 teaspoon salt in 1 cup warm water. Using saline irrigation can help prevent and treat sinus infections.   f you develop worsening sinus pain, fever or notice severe headache and vision changes, or if symptoms are not better after completion of antibiotic, please schedule an appointment with a health care provider in person.  Sinus infections are not as easily transmitted as other respiratory infection, however we still recommend that you avoid close contact with loved ones, especially the very young and elderly.  Remember to wash your hands thoroughly throughout the day as this is the number one way to prevent the spread of infection!  Home Care: Only take medications as instructed by your medical team. Complete the entire course of an antibiotic. Do not take these medications with alcohol. A steam  or ultrasonic humidifier can help congestion.  You can place a towel over your head and breathe in the steam from hot water coming from a faucet. Avoid close contacts especially the very young and the elderly. Cover your mouth when you cough or sneeze. Always remember to wash your hands.  Get Help Right Away If: You develop worsening fever or sinus pain. You develop a severe head ache or visual changes. Your symptoms persist after you have completed your treatment plan.  Make sure you Understand these instructions. Will watch your condition. Will get help right away if you are not doing well or get worse.  Thank you for choosing an e-visit.  Your e-visit answers were reviewed by a board certified advanced clinical practitioner to complete your personal care plan. Depending upon the condition, your plan could have included both over the counter or prescription medications.  Please review your pharmacy choice. Make sure the pharmacy is open so you can pick up prescription now. If there is a problem, you may contact your provider through [employer] and have the prescription routed to another pharmacy.  Your safety is important to [place]. If you have drug allergies check your prescription carefully.   For the next 24 hours you can use MyChart to ask questions about today's visit, request a non-urgent call back, or ask for a work or school excuse. You will get an email in the next two days asking  about your experience. I hope that your e-visit has been valuable and will speed your recovery.   I have spent 5 minutes in review of e-visit questionnaire, review and updating patient chart, medical decision making and response to patient.   Willeen Cass, PhD, FNP-BC

## 2022-11-06 ENCOUNTER — Telehealth: Payer: Self-pay

## 2022-11-06 NOTE — Telephone Encounter (Signed)
Mychart msg sent

## 2023-01-29 ENCOUNTER — Ambulatory Visit (INDEPENDENT_AMBULATORY_CARE_PROVIDER_SITE_OTHER): Payer: Medicaid Other

## 2023-01-29 ENCOUNTER — Ambulatory Visit
Admission: EM | Admit: 2023-01-29 | Discharge: 2023-01-29 | Disposition: A | Discharge status: Home / Self Care | Payer: Medicaid Other

## 2023-01-29 DIAGNOSIS — S90121A Contusion of right lesser toe(s) without damage to nail, initial encounter: Secondary | ICD-10-CM

## 2023-01-29 NOTE — ED Triage Notes (Signed)
Pt c/o right foot pain pt states she walked into something this morning and felt like it bent the pinky toe to the side, putting her weight on it hurts.

## 2023-01-29 NOTE — Discharge Instructions (Addendum)
The x-ray is negative for fracture or dislocation.  Based on the mechanism of injury, it appears that you may have bruised the right small toe. You have been provided an Ace wrap and postop shoe to provide compression and support.  Recommend use with prolonged or strenuous activity. RICE therapy, rest, ice, compression, and elevation.  Apply ice.  This is much as you can with first 48 hours. May take over-the-counter Tylenol or ibuprofen as needed for pain, fever, general discomfort. Symptoms are not improving over the next 1 to 2 weeks, it is recommended for you to follow-up with orthopedics for further evaluation.  You can follow-up with [facility] at [phone] or with [facility] at [phone]. Follow-up as needed.

## 2023-01-29 NOTE — ED Provider Notes (Signed)
[facility]    CSN: [id number] Arrival date & time: 01/29/23  1540      History   Chief Complaint No chief complaint on file.   HPI Heather Matthews is a 37 y.o. female.   The history is provided by the patient.   The patient presents for complaints of right foot pain after she stepped her right 5th toe at home this morning.  Patient states that she has tenderness to the right 5th toe and increased foot pain with ambulation, she also reports decreased range of motion to the right foot and toes.  Patient states that she does not have any numbness or tingling in the right foot.  She states that she did go to work and try to stand on it, and noticed that her tennis shoes even cause pain and discomfort to the right 5th toe.  Patient denies any previous injury or trauma to the right foot.  Past Medical History:  Diagnosis Date   Abnormal Pap smear    Anemia    Medical history non-contributory    No pertinent past medical history    Palpitations    no current issues   Pregnancy induced hypertension     Patient Active Problem List   Diagnosis Date Noted   Encounter for IUD removal and reinsertion 01/25/2020    Past Surgical History:  Procedure Laterality Date   NO PAST SURGERIES      OB History     Gravida  1   Para  1   Term  1   Preterm  0   AB  0   Living  1      SAB  0   IAB  0   Ectopic  0   Multiple  0   Live Births  1            Home Medications    Prior to Admission medications   Medication Sig Start Date End Date Taking? Authorizing Provider  FLUoxetine (PROZAC) 20 MG capsule Take 20 mg by mouth daily. 10/29/22  Yes [provider]  FLUoxetine (PROZAC) 40 MG capsule Take 40 mg by mouth daily. 11/13/22  Yes [provider]  fluticasone (FLONASE) 50 MCG/ACT nasal spray Place 2 sprays into both nostrils daily. 01/02/23  Yes [provider]  montelukast (SINGULAIR) 10 MG tablet Take 10 mg by mouth daily.  11/13/22  Yes [provider]  amoxicillin-clavulanate (AUGMENTIN) 875-125 MG tablet Take 1 tablet by mouth 2 (two) times daily. 09/20/22   Cathlyn Parsons, NP  Ascorbic Acid (VITAMIN C GUMMIE PO) Take by mouth.    [provider]  Ascorbic Acid (VITAMIN C) 100 MG tablet Take 100 mg by mouth daily.    [provider]  benzonatate (TESSALON) 100 MG capsule Take 1 capsule (100 mg total) by mouth 3 (three) times daily as needed for cough. Do not take with alcohol or while driving or operating heavy machinery. May cause drowsiness. 09/13/22   Valentino Nose, NP  calcium carbonate (TUMS - DOSED IN MG ELEMENTAL CALCIUM) 500 MG chewable tablet Chew 3 tablets by mouth 3 (three) times daily as needed for heartburn.    [provider]  cetirizine (ZYRTEC) 10 MG tablet Take 10 mg by mouth daily.    [provider]  flintstones complete (FLINTSTONES) 60 MG chewable tablet Chew 2 tablets by mouth daily.    [provider]  ibuprofen (ADVIL,MOTRIN) 600 MG tablet Take 1 tablet (  600 mg total) by mouth every 6 (six) hours as needed for pain. 12/19/12   Latrelle Dodrill, MD  levocetirizine Elita Boone) 2.5 MG/5ML solution Take 2.5 mg by mouth every evening.    [provider]  phentermine (ADIPEX-P) 37.5 MG tablet Take 37.5 mg by mouth daily.    [provider]  promethazine-dextromethorphan (PROMETHAZINE-DM) 6.25-15 MG/5ML syrup Take 5 mLs by mouth at bedtime as needed for cough. Do not take with alcohol or while driving or operating heavy machinery.  May cause drowsiness. 09/13/22   Valentino Nose, NP    Family History Family History  Problem Relation Age of Onset   Diabetes Other    Hypertension Mother     Social History Social History   Tobacco Use   Smoking status: Never   Smokeless tobacco: Never  Substance Use Topics   Alcohol use: Not Currently    Comment: occ wine   Drug use: No     Allergies   Patient has no known  allergies.   Review of Systems Review of Systems Per HPI  Physical Exam Triage Vital Signs ED Triage Vitals  Enc Vitals Group     BP 01/29/23 1545 (!) 157/92     Pulse Rate 01/29/23 1545 79     Resp 01/29/23 1545 15     Temp 01/29/23 1545 97.8 F (36.6 C)     Temp Source 01/29/23 1545 Oral     SpO2 01/29/23 1545 98 %     Weight --      Height --      Head Circumference --      Peak Flow --      Pain Score 01/29/23 1547 5     Pain Loc --      Pain Edu? --      Excl. in [place]? --    No data found.  Updated Vital Signs BP (!) 157/92 (BP Location: Right Arm)   Pulse 79   Temp 97.8 F (36.6 C) (Oral)   Resp 15   SpO2 98%   Visual Acuity Right Eye Distance:   Left Eye Distance:   Bilateral Distance:    Right Eye Near:   Left Eye Near:    Bilateral Near:     Physical Exam Vitals and nursing note reviewed.  Constitutional:      General: She is not in acute distress.    Appearance: Normal appearance.  Eyes:     Extraocular Movements: Extraocular movements intact.     Pupils: Pupils are equal, round, and reactive to light.  Pulmonary:     Effort: Pulmonary effort is normal.  Musculoskeletal:     Right foot: Decreased range of motion. Normal capillary refill. Tenderness (Tenderness noted to the right fifth toe.  There is no obvious ecchymosis, swelling, or erythema present.) present. No swelling or deformity. Normal pulse.  Skin:    General: Skin is warm and dry.  Neurological:     General: No focal deficit present.     Mental Status: She is alert and oriented to person, place, and time.  Psychiatric:        Mood and Affect: Mood normal.        Behavior: Behavior normal.     UC Treatments / Results  Labs (all labs ordered are listed, but only abnormal results are displayed) Labs Reviewed - No data to display  EKG   Radiology No results found.  Procedures Procedures (including critical care time)  Medications Ordered in  UC Medications - No data to  display  Initial Impression / Assessment and Plan / UC Course  I have reviewed the triage vital signs and the nursing notes.  Pertinent labs & imaging results that were available during my care of the patient were reviewed by me and considered in my medical decision making (see chart for details).  The patient is well-appearing, she is in no acute distress, vital signs are stable.  X-ray is negative for fracture or dislocation.  Symptoms appear to be consistent with a contusion of the right fifth toe.  Patient was provided a postop shoe and an Ace wrap to provide compression and support.  Supportive care recommendations were provided and discussed with the patient to include RICE therapy, and gentle range of motion exercises.  Patient was advised that if symptoms do not improve over the next 1 to 2 weeks, would like for her to consider following up with orthopedics for further evaluation.  Patient was given information for [facility]  and for [facility].  Patient is in agreement with this understanding.  All questions were answered.  Patient stable for discharge.   Final Clinical Impressions(s) / UC Diagnoses   Final diagnoses:  None   Discharge Instructions   None    ED Prescriptions   None    PDMP not reviewed this encounter.   Abran Cantor, NP 01/29/23 [phone]

## 2023-08-26 ENCOUNTER — Ambulatory Visit
Admission: EM | Admit: 2023-08-26 | Discharge: 2023-08-26 | Disposition: A | Discharge status: Home / Self Care | Payer: Medicaid Other

## 2023-08-26 DIAGNOSIS — J0100 Acute maxillary sinusitis, unspecified: Secondary | ICD-10-CM

## 2023-08-26 MED ORDER — AMOXICILLIN-POT CLAVULANATE 875-125 MG PO TABS: 1.0000 | ORAL_TABLET | Freq: Two times a day (BID) | ORAL | 0 refills | Status: DC

## 2023-08-26 MED ORDER — PSEUDOEPH-BROMPHEN-DM 30-2-10 MG/5ML PO SYRP: 5.0000 mL | ORAL_SOLUTION | Freq: Four times a day (QID) | ORAL | 0 refills | Status: DC | PRN

## 2023-08-26 NOTE — ED Triage Notes (Signed)
Cough, congestion, headache, chills, body aches, ear pain, sinus pain and pressure x 2 weeks. Pt states she did take prednisone last week and did help for 2-3 days then symptoms came right back. Taking dyquil, robitussin with no relief of symptoms.

## 2023-08-26 NOTE — ED Provider Notes (Signed)
[facility]    CSN: [id number] Arrival date & time: 08/26/23  1526      History   Chief Complaint Chief Complaint  Patient presents with   Cough   Nasal Congestion    HPI Heather Matthews is a 37 y.o. female.   The history is provided by the patient.   Patient presents with an almost 2-week history of chills, body aches, headache, ear pain/pressure, nasal congestion, and sinus pressure.  Patient also endorses cough, as well as lightheadedness and dizziness..  Patient states that she did complete an e-visit on 08/10/2023 and was prescribed prednisone.  She states initially she seemed to get better, but then became worse.  She states she has continued to take Mucinex, Robitussin, DayQuil, and her albuterol inhaler with minimal relief.  Patient states that she does have a history of underlying seasonal allergies.  Past Medical History:  Diagnosis Date   Abnormal Pap smear    Anemia    Medical history non-contributory    No pertinent past medical history    Palpitations    no current issues   Pregnancy induced hypertension     Patient Active Problem List   Diagnosis Date Noted   Encounter for IUD removal and reinsertion 01/25/2020    Past Surgical History:  Procedure Laterality Date   NO PAST SURGERIES      OB History     Gravida  1   Para  1   Term  1   Preterm  0   AB  0   Living  1      SAB  0   IAB  0   Ectopic  0   Multiple  0   Live Births  1            Home Medications    Prior to Admission medications   Medication Sig Start Date End Date Taking? Authorizing Provider  albuterol (VENTOLIN HFA) 108 (90 Base) MCG/ACT inhaler Inhale into the lungs every 6 (six) hours as needed for wheezing or shortness of breath.   Yes [provider]  amoxicillin-clavulanate (AUGMENTIN) 875-125 MG tablet Take 1 tablet by mouth every 12 (twelve) hours. 08/26/23  Yes Leath-Warren, Sadie Haber, NP  brompheniramine-pseudoephedrine-DM  30-2-10 MG/5ML syrup Take 5 mLs by mouth 4 (four) times daily as needed. 08/26/23  Yes Leath-Warren, Sadie Haber, NP  FLUoxetine (PROZAC) 40 MG capsule Take 40 mg by mouth daily. 11/13/22  Yes [provider]  fluticasone (FLONASE) 50 MCG/ACT nasal spray Place 2 sprays into both nostrils daily. 01/02/23  Yes [provider]  levocetirizine (XYZAL) 2.5 MG/5ML solution Take 2.5 mg by mouth every evening.   Yes [provider]  montelukast (SINGULAIR) 10 MG tablet Take 10 mg by mouth daily. 11/13/22  Yes [provider]  predniSONE (DELTASONE) 20 MG tablet Take 20 mg by mouth 2 (two) times daily. 08/10/23  Yes [provider]  Ascorbic Acid (VITAMIN C GUMMIE PO) Take by mouth.    [provider]  Ascorbic Acid (VITAMIN C) 100 MG tablet Take 100 mg by mouth daily.    [provider]  benzonatate (TESSALON) 100 MG capsule Take 1 capsule (100 mg total) by mouth 3 (three) times daily as needed for cough. Do not take with alcohol or while driving or operating heavy machinery. May cause drowsiness. 09/13/22   Valentino Nose, NP  calcium carbonate (TUMS - DOSED IN MG ELEMENTAL CALCIUM) 500 MG chewable tablet Chew 3  tablets by mouth 3 (three) times daily as needed for heartburn.    [provider]  cetirizine (ZYRTEC) 10 MG tablet Take 10 mg by mouth daily.    [provider]  flintstones complete (FLINTSTONES) 60 MG chewable tablet Chew 2 tablets by mouth daily.    [provider]  FLUoxetine (PROZAC) 20 MG capsule Take 20 mg by mouth daily. 10/29/22   [provider]  ibuprofen (ADVIL,MOTRIN) 600 MG tablet Take 1 tablet (600 mg total) by mouth every 6 (six) hours as needed for pain. 12/19/12   Latrelle Dodrill, MD  phentermine (ADIPEX-P) 37.5 MG tablet Take 37.5 mg by mouth daily.    [provider]  promethazine-dextromethorphan (PROMETHAZINE-DM) 6.25-15 MG/5ML syrup Take 5 mLs by mouth at bedtime as  needed for cough. Do not take with alcohol or while driving or operating heavy machinery.  May cause drowsiness. 09/13/22   Valentino Nose, NP    Family History Family History  Problem Relation Age of Onset   Diabetes Other    Hypertension Mother     Social History Social History   Tobacco Use   Smoking status: Never   Smokeless tobacco: Never  Substance Use Topics   Alcohol use: Not Currently    Comment: occ wine   Drug use: No     Allergies   Patient has no known allergies.   Review of Systems Review of Systems Per HPI  Physical Exam Triage Vital Signs ED Triage Vitals [08/26/23 1753]  Encounter Vitals Group     BP 137/89     Systolic BP Percentile      Diastolic BP Percentile      Pulse Rate 94     Resp 16     Temp 98.3 F (36.8 C)     Temp Source Oral     SpO2 97 %     Weight      Height      Head Circumference      Peak Flow      Pain Score 3     Pain Loc      Pain Education      Exclude from Growth Chart    No data found.  Updated Vital Signs BP 137/89 (BP Location: Right Arm)   Pulse 94   Temp 98.3 F (36.8 C) (Oral)   Resp 16   LMP  (LMP Unknown)   SpO2 97%   Visual Acuity Right Eye Distance:   Left Eye Distance:   Bilateral Distance:    Right Eye Near:   Left Eye Near:    Bilateral Near:     Physical Exam Vitals and nursing note reviewed.  Constitutional:      General: She is not in acute distress.    Appearance: Normal appearance.  HENT:     Head: Normocephalic.     Right Ear: Tympanic membrane, ear canal and external ear normal.     Left Ear: Tympanic membrane, ear canal and external ear normal.     Nose: Congestion present.     Right Turbinates: Enlarged and swollen.     Left Turbinates: Enlarged and swollen.     Right Sinus: Maxillary sinus tenderness present. No frontal sinus tenderness.     Left Sinus: Maxillary sinus tenderness present. No frontal sinus tenderness.     Mouth/Throat:     Lips: Pink.     Mouth:  Mucous membranes are moist.     Pharynx: Oropharynx is clear. Uvula  midline. Postnasal drip present. No pharyngeal swelling, oropharyngeal exudate, posterior oropharyngeal erythema or uvula swelling.  Eyes:     Extraocular Movements: Extraocular movements intact.     Conjunctiva/sclera: Conjunctivae normal.     Pupils: Pupils are equal, round, and reactive to light.  Cardiovascular:     Rate and Rhythm: Normal rate and regular rhythm.     Pulses: Normal pulses.     Heart sounds: Normal heart sounds.  Pulmonary:     Effort: Pulmonary effort is normal.     Breath sounds: Normal breath sounds.  Abdominal:     General: Bowel sounds are normal.     Palpations: Abdomen is soft.     Tenderness: There is no abdominal tenderness.  Musculoskeletal:     Cervical back: Normal range of motion.  Lymphadenopathy:     Cervical: No cervical adenopathy.  Skin:    General: Skin is warm and dry.  Neurological:     General: No focal deficit present.     Mental Status: She is alert and oriented to person, place, and time.  Psychiatric:        Mood and Affect: Mood normal.        Behavior: Behavior normal.      UC Treatments / Results  Labs (all labs ordered are listed, but only abnormal results are displayed) Labs Reviewed - No data to display  EKG   Radiology No results found.  Procedures Procedures (including critical care time)  Medications Ordered in UC Medications - No data to display  Initial Impression / Assessment and Plan / UC Course  I have reviewed the triage vital signs and the nursing notes.  Pertinent labs & imaging results that were available during my care of the patient were reviewed by me and considered in my medical decision making (see chart for details).  Symptoms consistent with a maxillary sinusitis.  Symptoms have been present for greater than 10 days with rebound worsening.  Will start patient on Augmentin 875/125 mg tablets to treat sinusitis, and Bromfed-DM  for her cough..  Patient advised to continue her current allergy regimen to include Zyrtec and Flonase.  Supportive care recommendations were provided and discussed with the patient to include over-the-counter analgesics, fluids, rest, normal saline nasal spray, and use of a humidifier at nighttime during sleep.  Patient was given indications of a follow-up be necessary.  Patient was in agreement with this plan of care and verbalized understanding.  All questions were answered.  Patient stable for discharge.  Final Clinical Impressions(s) / UC Diagnoses   Final diagnoses:  Acute maxillary sinusitis, recurrence not specified     Discharge Instructions      Take medication as directed. Continue your current allergy medication regimen. Increase fluids and get plenty of rest. May take over-the-counter ibuprofen or Tylenol as needed for pain, fever, or general discomfort. Recommend normal saline nasal spray to help with nasal congestion throughout the day. For your cough, it may be helpful to use a humidifier at bedtime during sleep. If symptoms do not improve with this treatment, you may follow-up in this clinic or with your primary care physician for further evaluation. Follow-up as needed.     ED Prescriptions     Medication Sig Dispense Auth. Provider   amoxicillin-clavulanate (AUGMENTIN) 875-125 MG tablet Take 1 tablet by mouth every 12 (twelve) hours. 14 tablet Leath-Warren, Sadie Haber, NP   brompheniramine-pseudoephedrine-DM 30-2-10 MG/5ML syrup Take 5 mLs by mouth 4 (four) times daily as needed. 140  mL Leath-Warren, Sadie Haber, NP      PDMP not reviewed this encounter.   Abran Cantor, NP 08/26/23 1815

## 2023-08-26 NOTE — Discharge Instructions (Signed)
Take medication as directed. Continue your current allergy medication regimen. Increase fluids and get plenty of rest. May take over-the-counter ibuprofen or Tylenol as needed for pain, fever, or general discomfort. Recommend normal saline nasal spray to help with nasal congestion throughout the day. For your cough, it may be helpful to use a humidifier at bedtime during sleep. If symptoms do not improve with this treatment, you may follow-up in this clinic or with your primary care physician for further evaluation. Follow-up as needed.

## 2023-08-27 ENCOUNTER — Ambulatory Visit: Payer: Self-pay

## 2024-04-14 ENCOUNTER — Ambulatory Visit: Admission: EM | Admit: 2024-04-14 | Discharge: 2024-04-14 | Disposition: A | Discharge status: Home / Self Care

## 2024-04-14 ENCOUNTER — Encounter: Payer: Self-pay | Admitting: Emergency Medicine

## 2024-04-14 ENCOUNTER — Other Ambulatory Visit: Payer: Self-pay

## 2024-04-14 DIAGNOSIS — J3089 Other allergic rhinitis: Secondary | ICD-10-CM | POA: Diagnosis not present

## 2024-04-14 DIAGNOSIS — J209 Acute bronchitis, unspecified: Secondary | ICD-10-CM | POA: Diagnosis not present

## 2024-04-14 MED ORDER — PROMETHAZINE-DM 6.25-15 MG/5ML PO SYRP: 5.0000 mL | ORAL_SOLUTION | Freq: Four times a day (QID) | ORAL | 0 refills | Status: DC | PRN

## 2024-04-14 MED ORDER — ALBUTEROL SULFATE HFA 108 (90 BASE) MCG/ACT IN AERS: 2.0000 | INHALATION_SPRAY | RESPIRATORY_TRACT | 0 refills | Status: DC | PRN

## 2024-04-14 MED ORDER — FLUTICASONE PROPIONATE HFA 110 MCG/ACT IN AERO: 2.0000 | INHALATION_SPRAY | Freq: Two times a day (BID) | RESPIRATORY_TRACT | 0 refills | Status: AC

## 2024-04-14 NOTE — Discharge Instructions (Signed)
 Continue daily allergy pill, nasal sprays twice daily such as Flonase  or Astelin, and may use Mucinex, saline sinus rinses, humidifiers and other supportive remedies.  I have prescribed a steroid inhaler, and a rescue inhaler for as needed use and a cough syrup

## 2024-04-14 NOTE — ED Provider Notes (Signed)
 [facility]    CSN: [id number] Arrival date & time: 04/14/24  1111      History   Chief Complaint Chief Complaint  Patient presents with   Cough    HPI Heather Matthews is a 38 y.o. female.   Patient presenting today with 3-week history of what was initially diagnosed as an upper respiratory infection and bilateral ear infection by PCP, states she took a course of antibiotics and was given a steroid shot for this.  States she started feeling a bit better after that but symptoms have again worsened mainly in her chest so she called her primary care several days ago who prescribed several days of prednisone but she is still feeling symptomatic with a hacking cough, congestion, ear pressure.  Denies fever, chills, wheezing, shortness of breath, chest pain.  Not currently taking anything over-the-counter other than her typical allergy medication.  No diagnosed history of chronic pulmonary disease per patient.    Past Medical History:  Diagnosis Date   Abnormal Pap smear    Anemia    Medical history non-contributory    No pertinent past medical history    Palpitations    no current issues   Pregnancy induced hypertension     Patient Active Problem List   Diagnosis Date Noted   Encounter for IUD removal and reinsertion 01/25/2020    Past Surgical History:  Procedure Laterality Date   NO PAST SURGERIES      OB History     Gravida  1   Para  1   Term  1   Preterm  0   AB  0   Living  1      SAB  0   IAB  0   Ectopic  0   Multiple  0   Live Births  1            Home Medications    Prior to Admission medications   Medication Sig Start Date End Date Taking? Authorizing Provider  albuterol  (VENTOLIN  HFA) 108 (90 Base) MCG/ACT inhaler Inhale 2 puffs into the lungs every 4 (four) hours as needed. 04/14/24  Yes Stuart Vernell Norris, PA-C  fluticasone  (FLOVENT  HFA) 110 MCG/ACT inhaler Inhale 2 puffs into the lungs 2 (two) times daily. Rinse  mouth with water after each use 04/14/24  Yes Stuart Vernell Norris, PA-C  methylphenidate 27 MG PO CR tablet Take 27 mg by mouth every morning. 03/16/24  Yes [provider]  promethazine -dextromethorphan (PROMETHAZINE -DM) 6.25-15 MG/5ML syrup Take 5 mLs by mouth 4 (four) times daily as needed. 04/14/24  Yes Stuart Vernell Norris, PA-C  albuterol  (VENTOLIN  HFA) 108 (90 Base) MCG/ACT inhaler Inhale into the lungs every 6 (six) hours as needed for wheezing or shortness of breath.    [provider]  amoxicillin -clavulanate (AUGMENTIN ) 875-125 MG tablet Take 1 tablet by mouth every 12 (twelve) hours. 08/26/23   Leath-Warren, Etta PARAS, NP  Ascorbic Acid (VITAMIN C GUMMIE PO) Take by mouth.    [provider]  Ascorbic Acid (VITAMIN C) 100 MG tablet Take 100 mg by mouth daily.    [provider]  benzonatate  (TESSALON ) 100 MG capsule Take 1 capsule (100 mg total) by mouth 3 (three) times daily as needed for cough. Do not take with alcohol or while driving or operating heavy machinery. May cause drowsiness. 09/13/22   Chandra Harlene LABOR, NP  brompheniramine-pseudoephedrine-DM 30-2-10 MG/5ML syrup Take 5 mLs by mouth 4 (four) times daily as needed.  08/26/23   Leath-Warren, Etta PARAS, NP  calcium carbonate (TUMS - DOSED IN MG ELEMENTAL CALCIUM) 500 MG chewable tablet Chew 3 tablets by mouth 3 (three) times daily as needed for heartburn.    [provider]  cetirizine (ZYRTEC) 10 MG tablet Take 10 mg by mouth daily.    [provider]  flintstones complete (FLINTSTONES) 60 MG chewable tablet Chew 2 tablets by mouth daily.    [provider]  FLUoxetine (PROZAC) 20 MG capsule Take 30 mg by mouth daily. 10/29/22   [provider]  FLUoxetine (PROZAC) 40 MG capsule Take 40 mg by mouth daily. 11/13/22   [provider]  fluticasone  (FLONASE ) 50 MCG/ACT nasal spray Place 2 sprays into both nostrils daily. 01/02/23   [provider]   ibuprofen  (ADVIL ,MOTRIN ) 600 MG tablet Take 1 tablet (600 mg total) by mouth every 6 (six) hours as needed for pain. 12/19/12   Donah Laymon PARAS, MD  levocetirizine (XYZAL) 2.5 MG/5ML solution Take 2.5 mg by mouth every evening.    [provider]  montelukast (SINGULAIR) 10 MG tablet Take 10 mg by mouth daily. Patient not taking: Reported on 04/14/2024 11/13/22   [provider]  phentermine (ADIPEX-P) 37.5 MG tablet Take 37.5 mg by mouth daily.    [provider]  predniSONE (DELTASONE) 20 MG tablet Take 20 mg by mouth 2 (two) times daily. 08/10/23   [provider]  promethazine -dextromethorphan (PROMETHAZINE -DM) 6.25-15 MG/5ML syrup Take 5 mLs by mouth at bedtime as needed for cough. Do not take with alcohol or while driving or operating heavy machinery.  May cause drowsiness. 09/13/22   Chandra Harlene LABOR, NP    Family History Family History  Problem Relation Age of Onset   Diabetes Other    Hypertension Mother     Social History Social History   Tobacco Use   Smoking status: Never   Smokeless tobacco: Never  Substance Use Topics   Alcohol use: Not Currently    Comment: occ wine   Drug use: No     Allergies   Patient has no known allergies.   Review of Systems Review of Systems Per HPI  Physical Exam Triage Vital Signs ED Triage Vitals  Encounter Vitals Group     BP 04/14/24 1121 130/89     Girls Systolic BP Percentile --      Girls Diastolic BP Percentile --      Boys Systolic BP Percentile --      Boys Diastolic BP Percentile --      Pulse Rate 04/14/24 1121 80     Resp 04/14/24 1121 20     Temp 04/14/24 1121 98.3 F (36.8 C)     Temp Source 04/14/24 1121 Oral     SpO2 04/14/24 1121 98 %     Weight --      Height --      Head Circumference --      Peak Flow --      Pain Score 04/14/24 1117 3     Pain Loc --      Pain Education --      Exclude from Growth Chart --    No data found.  Updated Vital Signs BP 130/89  (BP Location: Right Arm)   Pulse 80   Temp 98.3 F (36.8 C) (Oral)   Resp 20   SpO2 98%   Visual Acuity Right Eye Distance:   Left Eye Distance:   Bilateral Distance:  Right Eye Near:   Left Eye Near:    Bilateral Near:     Physical Exam Vitals and nursing note reviewed.  Constitutional:      Appearance: Normal appearance.  HENT:     Head: Atraumatic.     Right Ear: Tympanic membrane and external ear normal.     Left Ear: Tympanic membrane and external ear normal.     Nose: Rhinorrhea present.     Mouth/Throat:     Mouth: Mucous membranes are moist.     Pharynx: Posterior oropharyngeal erythema present.  Eyes:     Extraocular Movements: Extraocular movements intact.     Conjunctiva/sclera: Conjunctivae normal.  Cardiovascular:     Rate and Rhythm: Normal rate and regular rhythm.     Heart sounds: Normal heart sounds.  Pulmonary:     Effort: Pulmonary effort is normal.     Breath sounds: Normal breath sounds. No wheezing or rales.  Musculoskeletal:        General: Normal range of motion.     Cervical back: Normal range of motion and neck supple.  Skin:    General: Skin is warm and dry.  Neurological:     Mental Status: She is alert and oriented to person, place, and time.  Psychiatric:        Mood and Affect: Mood normal.        Thought Content: Thought content normal.      UC Treatments / Results  Labs (all labs ordered are listed, but only abnormal results are displayed) Labs Reviewed - No data to display  EKG   Radiology No results found.  Procedures Procedures (including critical care time)  Medications Ordered in UC Medications - No data to display  Initial Impression / Assessment and Plan / UC Course  I have reviewed the triage vital signs and the nursing notes.  Pertinent labs & imaging results that were available during my care of the patient were reviewed by me and considered in my medical decision making (see chart for details).      Vital signs and exam very reassuring today, suspect some lingering bronchitis and seasonal allergy exacerbation following recent upper respiratory infection.  Because she has already been on a steroid shot and prednisone pills, will switch to Flovent  inhaler for remainder of duration of illness as well as albuterol  inhaler as needed, Phenergan  DM, Mucinex, nasal sprays and allergy medication.  Discussed supportive over-the-counter medications, home care.  Return for worsening symptoms.  Final Clinical Impressions(s) / UC Diagnoses   Final diagnoses:  Acute bronchitis, unspecified organism  Seasonal allergic rhinitis due to other allergic trigger     Discharge Instructions      Continue daily allergy pill, nasal sprays twice daily such as Flonase  or Astelin, and may use Mucinex, saline sinus rinses, humidifiers and other supportive remedies.  I have prescribed a steroid inhaler, and a rescue inhaler for as needed use and a cough syrup    ED Prescriptions     Medication Sig Dispense Auth. Provider   fluticasone  (FLOVENT  HFA) 110 MCG/ACT inhaler Inhale 2 puffs into the lungs 2 (two) times daily. Rinse mouth with water after each use 1 each Stuart Vernell Norris, PA-C   albuterol  (VENTOLIN  HFA) 108 (90 Base) MCG/ACT inhaler Inhale 2 puffs into the lungs every 4 (four) hours as needed. 18 g Stuart Vernell Norris, PA-C   promethazine -dextromethorphan (PROMETHAZINE -DM) 6.25-15 MG/5ML syrup Take 5 mLs by mouth 4 (four) times daily as needed. 100 mL Stuart,  Vernell Norris, PA-C      PDMP not reviewed this encounter.   Stuart Vernell Norris, [place] 04/14/24 1208

## 2024-04-14 NOTE — ED Triage Notes (Addendum)
 Pt reports was treated for bilateral ear infection upper respiratory infection a few weeks ago and has finished course of steroids and abx. Reports chest heaviness and cough have persisted.

## 2024-04-27 ENCOUNTER — Ambulatory Visit (INDEPENDENT_AMBULATORY_CARE_PROVIDER_SITE_OTHER): Admitting: Adult Health

## 2024-04-27 ENCOUNTER — Encounter: Payer: Self-pay | Admitting: Adult Health

## 2024-04-27 ENCOUNTER — Other Ambulatory Visit (HOSPITAL_COMMUNITY)
Admission: RE | Admit: 2024-04-27 | Discharge: 2024-04-27 | Disposition: A | Discharge status: Home / Self Care | Source: Ambulatory Visit | Attending: Adult Health | Admitting: Adult Health

## 2024-04-27 VITALS — BP 135/94 | HR 121 | Ht 62.0 in | Wt 168.5 lb

## 2024-04-27 DIAGNOSIS — Z1329 Encounter for screening for other suspected endocrine disorder: Secondary | ICD-10-CM | POA: Diagnosis not present

## 2024-04-27 DIAGNOSIS — R6882 Decreased libido: Secondary | ICD-10-CM

## 2024-04-27 DIAGNOSIS — R635 Abnormal weight gain: Secondary | ICD-10-CM

## 2024-04-27 DIAGNOSIS — Z13220 Encounter for screening for lipoid disorders: Secondary | ICD-10-CM

## 2024-04-27 DIAGNOSIS — Z01419 Encounter for gynecological examination (general) (routine) without abnormal findings: Secondary | ICD-10-CM | POA: Diagnosis not present

## 2024-04-27 DIAGNOSIS — R5383 Other fatigue: Secondary | ICD-10-CM

## 2024-04-27 DIAGNOSIS — Z0000 Encounter for general adult medical examination without abnormal findings: Secondary | ICD-10-CM | POA: Insufficient documentation

## 2024-04-27 DIAGNOSIS — R030 Elevated blood-pressure reading, without diagnosis of hypertension: Secondary | ICD-10-CM

## 2024-04-27 DIAGNOSIS — Z3202 Encounter for pregnancy test, result negative: Secondary | ICD-10-CM | POA: Diagnosis not present

## 2024-04-27 DIAGNOSIS — Z131 Encounter for screening for diabetes mellitus: Secondary | ICD-10-CM

## 2024-04-27 DIAGNOSIS — N920 Excessive and frequent menstruation with regular cycle: Secondary | ICD-10-CM

## 2024-04-27 DIAGNOSIS — T8332XA Displacement of intrauterine contraceptive device, initial encounter: Secondary | ICD-10-CM | POA: Insufficient documentation

## 2024-04-27 DIAGNOSIS — Z1321 Encounter for screening for nutritional disorder: Secondary | ICD-10-CM

## 2024-04-27 LAB — POCT URINE PREGNANCY: Preg Test, Ur: NEGATIVE

## 2024-04-27 NOTE — Progress Notes (Signed)
 Patient ID: Heather Matthews, female   DOB: April 25, 1986, 38 y.o.   MRN: [id number] History of Present Illness:  Heather Matthews is a 38 year old white female, with SO, G1P1001 in for a well woman gyn exam and pap.  PCP is [facility] in [place]   Current Medications, Allergies, Past Medical History, Past Surgical History, Family History and Social History were reviewed in [employer] record.     Review of Systems: Patient denies any headaches, hearing loss,  blurred vision, shortness of breath, chest pain, abdominal pain, problems with bowel movements, urination(may lose urine if coughs), or intercourse. No joint pain or mood swings.  She is having lack of energy, weight gain, decreased sex drive, and brain fog, more hair on face  Has random spotting with IUD and feels like hormones out of whack    Physical Exam:BP (!) 135/94 (BP Location: Left Arm, Patient Position: Sitting, Cuff Size: Normal)   Pulse (!) 121   Ht 5' 2 (1.575 m)   Wt 168 lb 8 oz (76.4 kg)   BMI 30.82 kg/m  UPT is negative  General:  Well developed, well nourished, no acute distress Skin:  Warm and dry Neck:  Midline trachea, normal thyroid, good ROM, no lymphadenopathy Lungs; Clear to auscultation bilaterally Breast:  No dominant palpable mass, retraction, or nipple discharge Cardiovascular: Regular rate and rhythm Abdomen:  Soft, non tender, no hepatosplenomegaly Pelvic:  External genitalia is normal in appearance, no lesions.  The vagina is normal in appearance. Urethra has no lesions or masses. The cervix is bulbous, has nabothian cyst and 1 and 3 0'clock, no IUD strings at os, pap with HR HPV genotyping performed.  Uterus is felt to be normal size, shape, and contour.  No adnexal masses or tenderness noted.Bladder is non tender, no masses felt. Extremities/musculoskeletal:  No swelling or varicosities noted, no clubbing or cyanosis Psych:  No mood changes, alert and cooperative,seems happy AA is 0     04/27/2024    1:47 PM  Depression screen PHQ 2/9  Decreased Interest 0  Down, Depressed, Hopeless 0  PHQ - 2 Score 0  Altered sleeping 0  Tired, decreased energy 3  Change in appetite 3  Feeling bad or failure about yourself  0  Trouble concentrating 3  Moving slowly or fidgety/restless 0  Suicidal thoughts 0  PHQ-9 Score 9   On  prozac    04/27/2024    1:47 PM  GAD 7 : Generalized Anxiety Score  Nervous, Anxious, on Edge 1  Control/stop worrying 1  Worry too much - different things 1  Trouble relaxing 1  Restless 0  Easily annoyed or irritable 1  Afraid - awful might happen 0  Total GAD 7 Score 5      Upstream - 04/27/24 1343       Pregnancy Intention Screening   Does the patient want to become pregnant in the next year? No    Does the patient's partner want to become pregnant in the next year? No    Would the patient like to discuss contraceptive options today? No      Contraception Wrap Up   Current Method IUD or IUS    End Method IUD or IUS    Contraception Counseling Provided Yes          Examination chaperoned by Clarita Salt LPN  Impression and plan: 1. Routine general medical examination at a health care facility (Primary) Pap sent Pap in 3 years if negative  Will check labs  Physical in 1 year - Cytology - PAP( [place]) - CBC - Comprehensive metabolic panel with GFR - Lipid panel  2. Elevated BP without diagnosis of hypertension Had gestational hypertension Keep check on BP  - Comprehensive metabolic panel with GFR  3. Spotting Random spotting with IUD - CBC  4. Intrauterine contraceptive device threads lost, initial encounter No strings seen, will get US  at [facility] 05/01/24 at 2:30 pm to see for IUD Use condoms for now  - US  PELVIC COMPLETE WITH TRANSVAGINAL; Future Will will talk when US  results back and labs back   5. No energy Check labs  - CBC - TSH + free T4 - Hemoglobin A1c - VITAMIN D  25 Hydroxy (Vit-D Deficiency,  Fractures)  6. Weight gain - TSH + free T4 - Hemoglobin A1c  7. Decreased libido Talk with PCP about prozac, maybe wellbutrin better option   8. Screening cholesterol level - Lipid panel  9. Screening for thyroid disorder - TSH + free T4  10. Screening for diabetes mellitus - Hemoglobin A1c  11. Encounter for vitamin deficiency screening - VITAMIN D  25 Hydroxy (Vit-D Deficiency, Fractures)  12. Negative pregnancy test - POCT urine pregnancy

## 2024-04-28 LAB — COMPREHENSIVE METABOLIC PANEL WITH GFR
ALT: 16 IU/L (ref 0–32)
AST: 16 IU/L (ref 0–40)
Albumin: 4.5 g/dL (ref 3.9–4.9)
Alkaline Phosphatase: 68 IU/L (ref 44–121)
BUN/Creatinine Ratio: 18 (ref 9–23)
BUN: 13 mg/dL (ref 6–20)
Bilirubin Total: 0.5 mg/dL (ref 0.0–1.2)
CO2: 23 mmol/L (ref 20–29)
Calcium: 9.3 mg/dL (ref 8.7–10.2)
Chloride: 103 mmol/L (ref 96–106)
Creatinine, Ser: 0.74 mg/dL (ref 0.57–1.00)
Globulin, Total: 2.5 g/dL (ref 1.5–4.5)
Glucose: 123 mg/dL — ABNORMAL HIGH (ref 70–99)
Potassium: 4.1 mmol/L (ref 3.5–5.2)
Sodium: 139 mmol/L (ref 134–144)
Total Protein: 7 g/dL (ref 6.0–8.5)
eGFR: 107 mL/min/1.73 (ref >59)

## 2024-04-28 LAB — CBC
Hematocrit: 40.7 % (ref 34.0–46.6)
Hemoglobin: 13.4 g/dL (ref 11.1–15.9)
MCH: 29.9 pg (ref 26.6–33.0)
MCHC: 32.9 g/dL (ref 31.5–35.7)
MCV: 91 fL (ref 79–97)
Platelets: 357 x10E3/uL (ref 150–450)
RBC: 4.48 x10E6/uL (ref 3.77–5.28)
RDW: 12.8 % (ref 11.7–15.4)
WBC: 9.3 x10E3/uL (ref 3.4–10.8)

## 2024-04-28 LAB — HEMOGLOBIN A1C
Est. average glucose Bld gHb Est-mCnc: 108 mg/dL
Hgb A1c MFr Bld: 5.4 % (ref 4.8–5.6)

## 2024-04-28 LAB — LIPID PANEL
Chol/HDL Ratio: 3 ratio (ref 0.0–4.4)
Cholesterol, Total: 185 mg/dL (ref 100–199)
HDL: 62 mg/dL (ref >39)
LDL Chol Calc (NIH): 98 mg/dL (ref 0–99)
Triglycerides: 145 mg/dL (ref 0–149)
VLDL Cholesterol Cal: 25 mg/dL (ref 5–40)

## 2024-04-28 LAB — TSH+FREE T4
Free T4: 0.96 ng/dL (ref 0.82–1.77)
TSH: 6.28 u[IU]/mL — ABNORMAL HIGH (ref 0.450–4.500)

## 2024-04-28 LAB — VITAMIN D 25 HYDROXY (VIT D DEFICIENCY, FRACTURES): Vit D, 25-Hydroxy: 36.2 ng/mL (ref 30.0–100.0)

## 2024-04-29 ENCOUNTER — Other Ambulatory Visit: Payer: Self-pay | Admitting: Adult Health

## 2024-04-29 ENCOUNTER — Ambulatory Visit: Payer: Self-pay | Admitting: Adult Health

## 2024-04-29 DIAGNOSIS — R87610 Atypical squamous cells of undetermined significance on cytologic smear of cervix (ASC-US): Secondary | ICD-10-CM

## 2024-04-29 DIAGNOSIS — R7989 Other specified abnormal findings of blood chemistry: Secondary | ICD-10-CM | POA: Insufficient documentation

## 2024-04-29 MED ORDER — LEVOTHYROXINE SODIUM 25 MCG PO TABS: 25.0000 ug | ORAL_TABLET | Freq: Every day | ORAL | 1 refills | Status: DC

## 2024-04-30 DIAGNOSIS — R87619 Unspecified abnormal cytological findings in specimens from cervix uteri: Secondary | ICD-10-CM | POA: Insufficient documentation

## 2024-04-30 DIAGNOSIS — R87610 Atypical squamous cells of undetermined significance on cytologic smear of cervix (ASC-US): Secondary | ICD-10-CM | POA: Insufficient documentation

## 2024-04-30 LAB — CYTOLOGY - PAP
Comment: NEGATIVE
Comment: NEGATIVE
Comment: NEGATIVE
Diagnosis: UNDETERMINED — AB
HPV 16: NEGATIVE
HPV 18 / 45: POSITIVE — AB
High risk HPV: POSITIVE — AB

## 2024-05-01 ENCOUNTER — Ambulatory Visit (HOSPITAL_COMMUNITY)
Admission: RE | Admit: 2024-05-01 | Discharge: 2024-05-01 | Disposition: A | Discharge status: Home / Self Care | Source: Ambulatory Visit | Attending: Adult Health | Admitting: Adult Health

## 2024-05-01 DIAGNOSIS — T8332XA Displacement of intrauterine contraceptive device, initial encounter: Secondary | ICD-10-CM | POA: Diagnosis present

## 2024-06-02 ENCOUNTER — Encounter: Payer: Self-pay | Admitting: Women's Health

## 2024-06-02 ENCOUNTER — Ambulatory Visit: Admitting: Women's Health

## 2024-06-02 ENCOUNTER — Other Ambulatory Visit (HOSPITAL_COMMUNITY)
Admission: RE | Admit: 2024-06-02 | Discharge: 2024-06-02 | Disposition: A | Discharge status: Home / Self Care | Source: Ambulatory Visit | Attending: Women's Health | Admitting: Women's Health

## 2024-06-02 VITALS — BP 133/89 | HR 80 | Ht 62.0 in | Wt 174.0 lb

## 2024-06-02 DIAGNOSIS — Z3202 Encounter for pregnancy test, result negative: Secondary | ICD-10-CM

## 2024-06-02 DIAGNOSIS — R87610 Atypical squamous cells of undetermined significance on cytologic smear of cervix (ASC-US): Secondary | ICD-10-CM

## 2024-06-02 DIAGNOSIS — R030 Elevated blood-pressure reading, without diagnosis of hypertension: Secondary | ICD-10-CM

## 2024-06-02 LAB — POCT URINE PREGNANCY: Preg Test, Ur: NEGATIVE

## 2024-06-02 NOTE — Addendum Note (Signed)
 Addended by: SANNA GONG A on: 06/02/2024 02:31 PM   Modules accepted: Orders

## 2024-06-02 NOTE — Patient Instructions (Signed)
 Colposcopy, Care After  The following information offers guidance on how to care for yourself after your procedure. Your health care provider may also give you more specific instructions. If you have problems or questions, contact your health care provider. What can I expect after the procedure? If you had a colposcopy without a biopsy, you can expect to feel fine right away after your procedure. However, you may have some spotting of blood for a few days. You can return to your normal activities. If you had a colposcopy with a biopsy, it is common after the procedure to have: Soreness and mild pain. These may last for a few days. Mild vaginal bleeding or discharge that is dark-colored and grainy. This may last for a few days. The discharge may be caused by a liquid (solution) that was used during the procedure. You may need to wear a sanitary pad during this time. Spotting of blood for at least 48 hours after the procedure. Follow these instructions at home: Medicines Take over-the-counter and prescription medicines only as told by your health care provider. Talk with your health care provider about what type of over-the-counter pain medicines and prescription medicines you can start to take again. It is especially important to talk with your health care provider if you take blood thinners. Activity Avoid using douche products, using tampons, and having sex for at least 3 days after the procedure or for as long as told by your health care provider. Return to your normal activities as told by your health care provider. Ask your health care provider what activities are safe for you. General instructions Ask your health care provider if you may take baths, swim, or use a hot tub. You may take showers. If you use birth control (contraception), continue to use it. Keep all follow-up visits. This is important. Contact a health care provider if: You have a fever or chills. You faint or feel  light-headed. Get help right away if: You have heavy bleeding from your vagina or pass blood clots. Heavy bleeding is bleeding that soaks through a sanitary pad in less than 1 hour. You have vaginal discharge that is abnormal, is yellow in color, or smells bad. This could be a sign of infection. You have severe pain or cramps in your lower abdomen that do not go away with medicine. Summary If you had a colposcopy without a biopsy, you can expect to feel fine right away, but you may have some spotting of blood for a few days. You can return to your normal activities. If you had a colposcopy with a biopsy, it is common to have mild pain for a few days and spotting for 48 hours after the procedure. Avoid using douche products, using tampons, and having sex for at least 3 days after the procedure or for as long as told by your health care provider. Get help right away if you have heavy bleeding, severe pain, or signs of infection. This information is not intended to replace advice given to you by your health care provider. Make sure you discuss any questions you have with your health care provider. Document Revised: 01/22/2021 Document Reviewed: 01/22/2021 Elsevier Patient Education  2024 [employer].

## 2024-06-02 NOTE — Progress Notes (Addendum)
   COLPOSCOPY PROCEDURE NOTE Patient name: Heather Matthews MRN [id number]  Date of birth: 1985-09-30 Subjective Findings:   Heather Matthews is a 38 y.o. G25P1001 Caucasian female being seen today for a colposcopy. Went to PCP last week, bp normal.   Indication: Abnormal pap on 04/27/24: ASCUS w/ HRHPV positive: 18/45  Prior cytology:  Date Result Procedure  2013 NILM w/ HRHPV not done None   No LMP recorded. (Menstrual status: IUD). Contraception: IUD. Menopausal: no. Hysterectomy: no.   Considering pregnancy: No New sex partner: did not discuss Smoker: no. Immunocompromised: no.   The risks and benefits were explained and informed consent was obtained, and written copy is in chart. Pertinent History Reviewed:   Reviewed past medical,surgical, social, obstetrical and family history.  Reviewed problem list, medications and allergies. Objective Findings & Procedure:   Vitals:   06/02/24 1322 06/02/24 1405  BP: (!) 144/93 133/89  Pulse: 87 80  Weight: 174 lb (78.9 kg)   Height: 5' 2 (1.575 m)   Body mass index is 31.83 kg/m.  Results for orders placed or performed in visit on 06/02/24 (from the past 24 hours)  POCT urine pregnancy   Collection Time: 06/02/24  1:36 PM  Result Value Ref Range   Preg Test, Ur Negative Negative     Time out was performed.  Speculum placed in the vagina, cervix fully visualized. SCJ: not fully visualized. Cervix swabbed x 3 with acetic acid.  Acetowhitening present: Yes Cervix: no visible lesions, no mosaicism, no punctation, no abnormal vasculature, and acetowhite lesion(s) noted at 6 o'clock. Endocervical curettage performed, Cervical biopsies taken at 6 o'clock, and Hemostasis achieved with Monsel's solution. Vagina: vaginal colposcopy not performed Vulva: vulvar colposcopy not performed  Specimens: 2  Complications: none  Chaperone: Peggy Dones  Colposcopic Impression & Plan:   Colposcopy findings consistent with LSIL Plan: Post biopsy  instructions given, Will notify patient of results when back, and Will base plan of care on pathology results and ASCCP guidelines  Elevated bp w/o dx of HTN> went to PCP last week and bp normal, likely just d/t anxiety about procedure  Return in about 1 year (around 06/02/2025) for Pap & physical.  Suzen JONELLE Fetters CNM, WHNP-BC 06/02/2024 2:10 PM

## 2024-06-04 ENCOUNTER — Ambulatory Visit: Payer: Self-pay | Admitting: Women's Health

## 2024-06-04 ENCOUNTER — Telehealth: Payer: Self-pay | Admitting: Women's Health

## 2024-06-04 LAB — SURGICAL PATHOLOGY

## 2024-06-04 NOTE — Telephone Encounter (Signed)
 Discussed colpo results via phone per request. Has appt 10/6 w/ Dr. Ozan to discuss treatment.  Suzen FABIENE Fetters, CNM, [facility] 06/04/2024 4:28 PM

## 2024-06-12 ENCOUNTER — Ambulatory Visit: Admitting: Obstetrics & Gynecology

## 2024-06-12 ENCOUNTER — Encounter: Payer: Self-pay | Admitting: Obstetrics & Gynecology

## 2024-06-12 VITALS — BP 146/95 | HR 76 | Ht 62.0 in | Wt 176.0 lb

## 2024-06-12 DIAGNOSIS — R87810 Cervical high risk human papillomavirus (HPV) DNA test positive: Secondary | ICD-10-CM

## 2024-06-12 DIAGNOSIS — T8332XD Displacement of intrauterine contraceptive device, subsequent encounter: Secondary | ICD-10-CM

## 2024-06-12 DIAGNOSIS — N871 Moderate cervical dysplasia: Secondary | ICD-10-CM | POA: Diagnosis not present

## 2024-06-12 NOTE — Progress Notes (Signed)
   GYN VISIT Patient name: Heather Matthews MRN [id number]  Date of birth: 1986/05/27 Chief Complaint:   Follow-up  History of Present Illness:   Heather Matthews is a 38 y.o. G20P1001 female being seen today for the following concerns:  Initial Pap ASCUS, HPV 18+ >COLPOSCOPY> CIN 2 x 1, ECC negative.    Patient has IUD for contraception that has been in for at least 5 or 6 years.  Of note strings are not visualized and patient had pelvic ultrasound to confirm proper location.  She has noted some irregular spotting, which is new for her.  Denies pelvic or abdominal pain.  Reports no other acute GYN concerns  No LMP recorded. (Menstrual status: IUD).    Review of Systems:   Pertinent items are noted in HPI Denies fever/chills, dizziness, headaches, visual disturbances, fatigue, shortness of breath, chest pain, abdominal pain, vomiting. Pertinent History Reviewed:   Past Surgical History:  Procedure Laterality Date   NO PAST SURGERIES      Past Medical History:  Diagnosis Date   Abnormal Pap smear    Anemia    Bell's palsy    Medical history non-contributory    No pertinent past medical history    Palpitations    no current issues   Pregnancy induced hypertension    Reviewed problem list, medications and allergies. Physical Assessment:   Vitals:   06/12/24 0948 06/12/24 1011  BP: (!) 146/82 (!) 146/95  Pulse: 84 76  Weight: 176 lb (79.8 kg)   Height: 5' 2 (1.575 m)   Body mass index is 32.19 kg/m.       Physical Examination:   General appearance: alert, well appearing, and in no distress  Psych: mood appropriate, normal affect  Skin: warm & dry   Cardiovascular: normal heart rate noted  Respiratory: normal respiratory effort, no distress  Chaperone: N/A    Assessment & Plan:  1) High grade dysplasia - Reviewed ASCCP recommendations and plan for excisional procedure - Discussed risk benefit including but not limited to risk of bleeding, infection, shortened  cervix - Patient does not desire future pregnancy   2) lost IUD strings -plan for removal and replacement at time of LEEP -We discussed potential complication of trimming strings however patient noted that strings have already been lost and she would require hysteroscopy in the future  -Questions and concerns were addressed and patient does desire to proceed - Referral created for November 26 -medication list reviewed  3) elevated BP -pt feeling anxious about discussing upcoming surgical intervention -plan to monitor trend  No orders of the defined types were placed in this encounter.   Return in about 1 week (around 06/19/2024) for postop 1wk from 11/26.   Joane Postel, DO Attending Obstetrician & Gynecologist, [facility] for [employer], [facility]

## 2024-06-15 ENCOUNTER — Ambulatory Visit: Admitting: Obstetrics & Gynecology

## 2024-06-16 ENCOUNTER — Encounter: Payer: Self-pay | Admitting: Obstetrics & Gynecology

## 2024-06-22 ENCOUNTER — Encounter: Payer: Self-pay | Admitting: Obstetrics & Gynecology

## 2024-07-10 NOTE — Patient Instructions (Signed)
 Heather Matthews  07/10/2024     @PREFPERIOPPHARMACY @   Your procedure is scheduled on 07/14/2024.   Report to [facility] at  0600 A.M.   Call this number if you have problems the morning of surgery:  [phone]  If you experience any cold or flu symptoms such as cough, fever, chills, shortness of breath, etc. between now and your scheduled surgery, please notify us  at the above number.   Remember:  Do not eat after midnight.    You may have clear liquids until 0330 am on 07/14/2024.      Clear liquids allowed are:                    Water, Carbonated beverages (diabetics please choose diet or no sugar options), Black Coffee Only (No creamer, milk or cream, including half & half and powdered creamer), and Clear Sports drink (No red color; diabetics please choose diet or no sugar options)    Take these medicines the morning of surgery with A SIP OF WATER                                     fluoxetine, levothyroxine .    Do not wear jewelry, make-up or nail polish, including gel polish,  artificial nails, or any other type of covering on natural nails (fingers and  toes).  Do not wear lotions, powders, or perfumes, or deodorant.  Do not shave 48 hours prior to surgery.  Men may shave face and neck.  Do not bring valuables to the hospital.  [facility] is not responsible for any belongings or valuables.  Contacts, dentures or bridgework may not be worn into surgery.  Leave your suitcase in the car.  After surgery it may be brought to your room.  For patients admitted to the hospital, discharge time will be determined by your treatment team.  Patients discharged the day of surgery will not be allowed to drive home.   Special instructions:   DO NOT smoke tobacco or vape for 24 hours before your procedure.  Please read over the following fact sheets that you were given. Coughing and Deep Breathing, Surgical Site Infection Prevention, Anesthesia Post-op Instructions, and  Care and Recovery After Surgery      IUD PLACEMENT POST-PROCEDURE INSTRUCTIONS  You may take Ibuprofen , Aleve or Tylenol  for pain if needed.  Cramping should resolve within in 24 hours.  You may have a small amount of spotting.  You should wear a mini pad for the next few days.  You may have intercourse after 24 hours.  If you using this for birth control, it is effective immediately.  You need to call if you have any pelvic pain, fever, heavy bleeding or foul smelling vaginal discharge.  Irregular bleeding is common the first several months after having an IUD placed. You do not need to call for this reason unless you are concerned.  Shower or bathe as normal  You should have a follow-up appointment in 4-8 weeks for a re-check to make sure you are not having any problems.LEEP POST-PROCEDURE INSTRUCTIONS  You may take Ibuprofen , Aleve or Tylenol  for pain if needed.  Cramping is normal.  You will have black and/or bloody discharge at first.  This will lighten and then turn clear before completely resolving.  This will take 2 to 3 weeks.  Put nothing  in your vagina until the bleeding or discharge stops (usually 2 or3 days).  You need to call if you have redness around the biopsy site, if there is any unusual draining, if the bleeding is heavy, or if you are concerned.  Shower or bathe as normal  We will call you within one week with results or we will discuss the results at your follow-up appointment if needed.  You will need to return for a follow-up Pap smear as directed by your physician.General Anesthesia, Adult, Care After The following information offers guidance on how to care for yourself after your procedure. Your health care provider may also give you more specific instructions. If you have problems or questions, contact your health care provider. What can I expect after the procedure? After the procedure, it is common for people to: Have pain or discomfort at the IV  site. Have nausea or vomiting. Have a sore throat or hoarseness. Have trouble concentrating. Feel cold or chills. Feel weak, sleepy, or tired (fatigue). Have soreness and body aches. These can affect parts of the body that were not involved in surgery. Follow these instructions at home: For the time period you were told by your health care provider:  Rest. Do not participate in activities where you could fall or become injured. Do not drive or use machinery. Do not drink alcohol. Do not take sleeping pills or medicines that cause drowsiness. Do not make important decisions or sign legal documents. Do not take care of children on your own. General instructions Drink enough fluid to keep your urine pale yellow. If you have sleep apnea, surgery and certain medicines can increase your risk for breathing problems. Follow instructions from your health care provider about wearing your sleep device: Anytime you are sleeping, including during daytime naps. While taking prescription pain medicines, sleeping medicines, or medicines that make you drowsy. Return to your normal activities as told by your health care provider. Ask your health care provider what activities are safe for you. Take over-the-counter and prescription medicines only as told by your health care provider. Do not use any products that contain nicotine or tobacco. These products include cigarettes, chewing tobacco, and vaping devices, such as e-cigarettes. These can delay incision healing after surgery. If you need help quitting, ask your health care provider. Contact a health care provider if: You have nausea or vomiting that does not get better with medicine. You vomit every time you eat or drink. You have pain that does not get better with medicine. You cannot urinate or have bloody urine. You develop a skin rash. You have a fever. Get help right away if: You have trouble breathing. You have chest pain. You vomit  blood. These symptoms may be an emergency. Get help right away. Call 911. Do not wait to see if the symptoms will go away. Do not drive yourself to the hospital. Summary After the procedure, it is common to have a sore throat, hoarseness, nausea, vomiting, or to feel weak, sleepy, or fatigue. For the time period you were told by your health care provider, do not drive or use machinery. Get help right away if you have difficulty breathing, have chest pain, or vomit blood. These symptoms may be an emergency. This information is not intended to replace advice given to you by your health care provider. Make sure you discuss any questions you have with your health care provider. Document Revised: 11/24/2021 Document Reviewed: 11/24/2021 Elsevier Patient Education  2024 [employer].

## 2024-07-13 ENCOUNTER — Other Ambulatory Visit: Payer: Self-pay

## 2024-07-13 ENCOUNTER — Encounter (HOSPITAL_COMMUNITY): Payer: Self-pay

## 2024-07-13 ENCOUNTER — Encounter (HOSPITAL_COMMUNITY)
Admission: RE | Admit: 2024-07-13 | Discharge: 2024-07-13 | Disposition: A | Discharge status: Home / Self Care | Source: Ambulatory Visit | Attending: Obstetrics & Gynecology | Admitting: Obstetrics & Gynecology

## 2024-07-13 DIAGNOSIS — T8332XD Displacement of intrauterine contraceptive device, subsequent encounter: Secondary | ICD-10-CM | POA: Insufficient documentation

## 2024-07-13 DIAGNOSIS — Z01812 Encounter for preprocedural laboratory examination: Secondary | ICD-10-CM | POA: Insufficient documentation

## 2024-07-13 DIAGNOSIS — Z01818 Encounter for other preprocedural examination: Secondary | ICD-10-CM | POA: Diagnosis present

## 2024-07-13 DIAGNOSIS — R87613 High grade squamous intraepithelial lesion on cytologic smear of cervix (HGSIL): Secondary | ICD-10-CM | POA: Diagnosis not present

## 2024-07-13 LAB — CBC
HCT: 38.9 % (ref 36.0–46.0)
Hemoglobin: 13.2 g/dL (ref 12.0–15.0)
MCH: 30.6 pg (ref 26.0–34.0)
MCHC: 33.9 g/dL (ref 30.0–36.0)
MCV: 90.3 fL (ref 80.0–100.0)
Platelets: 353 K/uL (ref 150–400)
RBC: 4.31 MIL/uL (ref 3.87–5.11)
RDW: 12.6 % (ref 11.5–15.5)
WBC: 8.3 K/uL (ref 4.0–10.5)
nRBC: 0 % (ref 0.0–0.2)

## 2024-07-13 LAB — PREGNANCY, URINE: Preg Test, Ur: NEGATIVE

## 2024-07-13 NOTE — Anesthesia Preprocedure Evaluation (Signed)
 Anesthesia Evaluation  Patient identified by MRN, date of birth, ID band Patient awake    Reviewed: Allergy & Precautions, H&P , NPO status , Patient's Chart, lab work & pertinent test results, reviewed documented beta blocker date and time   Airway Mallampati: II  TM Distance: >3 FB Neck ROM: full    Dental no notable dental hx. (+) Dental Advisory Given, Teeth Intact   Pulmonary neg pulmonary ROS   Pulmonary exam normal breath sounds clear to auscultation       Cardiovascular Exercise Tolerance: Good hypertension, Normal cardiovascular exam Rhythm:regular Rate:Normal     Neuro/Psych Bell's palsy  Neuromuscular disease  negative psych ROS   GI/Hepatic negative GI ROS, Neg liver ROS,,,  Endo/Other  Hypothyroidism    Renal/GU negative Renal ROS  negative genitourinary   Musculoskeletal   Abdominal   Peds  Hematology negative hematology ROS (+)   Anesthesia Other Findings   Reproductive/Obstetrics negative OB ROS                              Anesthesia Physical Anesthesia Plan  ASA: 2  Anesthesia Plan: General   Post-op Pain Management: Dilaudid IV   Induction: Intravenous  PONV Risk Score and Plan: Ondansetron , Dexamethasone, Midazolam and Scopolamine patch - Pre-op  Airway Management Planned: LMA  Additional Equipment: None  Intra-op Plan:   Post-operative Plan:   Informed Consent: I have reviewed the patients History and Physical, chart, labs and discussed the procedure including the risks, benefits and alternatives for the proposed anesthesia with the patient or authorized representative who has indicated his/her understanding and acceptance.     Dental Advisory Given  Plan Discussed with: CRNA  Anesthesia Plan Comments:         Anesthesia Quick Evaluation

## 2024-07-13 NOTE — Progress Notes (Signed)
 Spoke w/ Dorella Varkey in person for pre-op interview 07/13/24 Lab needs dos none       Lab results pending COVID test -----patient states asymptomatic no test needed Arrive at 0600 NPO after MN NO Solid Food.  Clear liquids from MN until 0330 Pre-Surgery Ensure or G2: none  Med rec completed Medications to take morning of surgery Fluoxetine and levothyroxine  Diabetic medication none  GLP1 agonist last dose:none GLP1 instructions:none  Patient instructed no nail polish to be worn day of surgery Patient instructed to bring photo id and insurance card day of surgery Patient aware to have Driver (ride ) / caregiver    for 24 hours after surgery -  Patient Special Instructions none Pre-Op special Instructions none  Patient verbalized understanding of instructions that were given at this phone interview. Patient denies chest pain, sob, fever, cough at the interview.

## 2024-07-13 NOTE — H&P (Signed)
 [facility]ne[facility] Attending History and Physical  Heather Matthews is a 38 y.o. G1P1001 who presents for scheduled hysteroscopy, IUD replacement and LEEP  In review, Initial Pap ASCUS, HPV 18+ >COLPOSCOPY> CIN 2 x 1, ECC negative.     Patient has IUD for contraception that has been in for at least 5 or 6 years.  Of note strings are not visualized and patient had pelvic ultrasound to confirm proper location.   She has noted some irregular spotting, which is new for her.  Denies pelvic or abdominal pain.  Reports no other acute GYN concerns    Denies any abnormal vaginal discharge, fevers, chills, sweats, dysuria, nausea, vomiting, other GI or GU symptoms or other general symptoms.  Past Medical History:  Diagnosis Date   Abnormal Pap smear    Anemia    Bell's palsy    Medical history non-contributory    No pertinent past medical history    Palpitations    no current issues   Pregnancy induced hypertension    Past Surgical History:  Procedure Laterality Date   NO PAST SURGERIES     OB History  Gravida Para Term Preterm AB Living  1 1 1  0 0 1  SAB IAB Ectopic Multiple Live Births  0 0 0 0 1    # Outcome Date GA Lbr Len/2nd Weight Sex Type Anes PTL Lv  1 Term 12/17/12 [redacted]w[redacted]d 06:42 / 06:32 3370 g F Vag-Vacuum EPI  LIV  Patient denies any other pertinent gynecologic issues.  No current facility-administered medications on file prior to encounter.   Current Outpatient Medications on File Prior to Encounter  Medication Sig Dispense Refill   albuterol  (VENTOLIN  HFA) 108 (90 Base) MCG/ACT inhaler Inhale 1-2 puffs into the lungs every 6 (six) hours as needed for wheezing or shortness of breath.     FLUoxetine (PROZAC) 20 MG capsule Take 60 mg by mouth in the morning.     fluticasone  (FLONASE ) 50 MCG/ACT nasal spray Place 2 sprays into both nostrils daily as needed for allergies.     fluticasone  (FLOVENT  HFA) 110 MCG/ACT inhaler Inhale 2 puffs into the lungs 2  (two) times daily. Rinse mouth with water after each use (Patient taking differently: Inhale 2 puffs into the lungs 2 (two) times daily as needed (respiratory issues.). Rinse mouth with water after each use) 1 each 0   levonorgestrel  (MIRENA ) 20 MCG/DAY IUD 1 each by Intrauterine route once.     levothyroxine  (SYNTHROID ) 50 MCG tablet Take 50 mcg by mouth daily before breakfast.     methylphenidate 27 MG PO CR tablet Take 27 mg by mouth every morning.     No Known Allergies  Social History:   reports that she has never smoked. She has never used smokeless tobacco. She reports that she does not currently use alcohol. She reports that she does not use drugs. Family History  Problem Relation Age of Onset   Heart attack Paternal Grandfather    Seizures Father    Hypertension Mother    Diabetes Other    Seizures Daughter    Epilepsy Daughter     Review of Systems: Pertinent items noted in HPI and remainder of comprehensive ROS otherwise negative.  PHYSICAL EXAM: There were no vitals taken for this visit. CONSTITUTIONAL: Well-developed, well-nourished female in no acute distress.  SKIN: Skin is warm and dry. No rash noted. Not diaphoretic. No erythema. No pallor. NEUROLOGIC: Alert and oriented to person, place, and time. Normal  reflexes, muscle tone coordination. No cranial nerve deficit noted. PSYCHIATRIC: Normal mood and affect. Normal behavior. Normal judgment and thought content. CARDIOVASCULAR: Normal heart rate noted, regular rhythm RESPIRATORY: Effort and breath sounds normal, no problems with respiration noted ABDOMEN: Soft, nontender, nondistended. PELVIC: deferred MUSCULOSKELETAL: no calf tenderness bilaterally EXT: no edema bilaterally, normal pulses  Labs: No results found for this or any previous visit (from the past 2 weeks).  Imaging Studies: 05/06/24: 8.2 x 4.1 x 5.3cm uterus, IUD in fundal position.  Normal ovaries bilaterally  Assessment: Lost IUD High grade  dysplasia  Plan: Hysteroscopy, IUD removal and replacement, LEEP -NPO -LR @ 125cc/hr -SCDs to OR -Risk/benefits and alternatives reviewed with the patient including but not limited to risk of bleeding, infection and injury to surrounding organs.  Questions and concerns were addressed and pt desires to proceed  Rhyder Koegel, DO Attending Obstetrician & Gynecologist, [facility] for [facility], [facility]

## 2024-07-14 ENCOUNTER — Encounter (HOSPITAL_COMMUNITY): Admission: RE | Disposition: A | Payer: Self-pay | Source: Home / Self Care | Attending: Obstetrics & Gynecology

## 2024-07-14 ENCOUNTER — Encounter (HOSPITAL_COMMUNITY): Payer: Self-pay | Admitting: Obstetrics & Gynecology

## 2024-07-14 ENCOUNTER — Ambulatory Visit (HOSPITAL_COMMUNITY)
Admission: RE | Admit: 2024-07-14 | Discharge: 2024-07-14 | Disposition: A | Discharge status: Home / Self Care | Attending: Obstetrics & Gynecology | Admitting: Obstetrics & Gynecology

## 2024-07-14 ENCOUNTER — Ambulatory Visit (HOSPITAL_COMMUNITY): Payer: Self-pay | Admitting: Certified Registered Nurse Anesthetist

## 2024-07-14 DIAGNOSIS — R87613 High grade squamous intraepithelial lesion on cytologic smear of cervix (HGSIL): Secondary | ICD-10-CM | POA: Diagnosis present

## 2024-07-14 DIAGNOSIS — T8332XD Displacement of intrauterine contraceptive device, subsequent encounter: Secondary | ICD-10-CM | POA: Diagnosis not present

## 2024-07-14 DIAGNOSIS — Z8249 Family history of ischemic heart disease and other diseases of the circulatory system: Secondary | ICD-10-CM | POA: Insufficient documentation

## 2024-07-14 DIAGNOSIS — I10 Essential (primary) hypertension: Secondary | ICD-10-CM | POA: Diagnosis not present

## 2024-07-14 DIAGNOSIS — N871 Moderate cervical dysplasia: Secondary | ICD-10-CM

## 2024-07-14 DIAGNOSIS — T8332XA Displacement of intrauterine contraceptive device, initial encounter: Secondary | ICD-10-CM | POA: Insufficient documentation

## 2024-07-14 DIAGNOSIS — E039 Hypothyroidism, unspecified: Secondary | ICD-10-CM | POA: Diagnosis not present

## 2024-07-14 DIAGNOSIS — G510 Bell's palsy: Secondary | ICD-10-CM | POA: Insufficient documentation

## 2024-07-14 DIAGNOSIS — G709 Myoneural disorder, unspecified: Secondary | ICD-10-CM | POA: Insufficient documentation

## 2024-07-14 DIAGNOSIS — Y768 Miscellaneous obstetric and gynecological devices associated with adverse incidents, not elsewhere classified: Secondary | ICD-10-CM | POA: Diagnosis not present

## 2024-07-14 DIAGNOSIS — Z01818 Encounter for other preprocedural examination: Secondary | ICD-10-CM

## 2024-07-14 DIAGNOSIS — Z30433 Encounter for removal and reinsertion of intrauterine contraceptive device: Secondary | ICD-10-CM | POA: Diagnosis not present

## 2024-07-14 SURGERY — DILATATION AND CURETTAGE /HYSTEROSCOPY
Anesthesia: General | Site: "Vagina "

## 2024-07-14 MED ORDER — FENTANYL CITRATE (PF) 50 MCG/ML IJ SOSY: 25.0000 ug | PREFILLED_SYRINGE | INTRAMUSCULAR | Status: DC | PRN

## 2024-07-14 MED ORDER — FENTANYL CITRATE (PF) 100 MCG/2ML IJ SOLN
INTRAMUSCULAR | Status: DC | PRN
  Administered 2024-07-14 (×2): 25 ug via INTRAVENOUS

## 2024-07-14 MED ORDER — SCOPOLAMINE 1 MG/3DAYS TD PT72
1.0000 | MEDICATED_PATCH | Freq: Once | TRANSDERMAL | Status: DC
  Administered 2024-07-14: 1 mg via TRANSDERMAL

## 2024-07-14 MED ORDER — ORAL CARE MOUTH RINSE: 15.0000 mL | Freq: Once | OROMUCOSAL | Status: AC

## 2024-07-14 MED ORDER — CHLORHEXIDINE GLUCONATE 0.12 % MT SOLN
15.0000 mL | Freq: Once | OROMUCOSAL | Status: AC
Start: 2024-07-14 — End: 2024-07-14
  Administered 2024-07-14: 15 mL via OROMUCOSAL

## 2024-07-14 MED ORDER — ACETAMINOPHEN 500 MG PO TABS
1000.0000 mg | ORAL_TABLET | Freq: Once | ORAL | Status: AC
  Administered 2024-07-14: 1000 mg via ORAL

## 2024-07-14 MED ORDER — LIDOCAINE-EPINEPHRINE 0.5 %-1:200000 IJ SOLN
INTRAMUSCULAR | Status: AC
  Filled 2024-07-14: qty 50

## 2024-07-14 MED ORDER — SODIUM CHLORIDE 0.9 % IV SOLN: 12.5000 mg | INTRAVENOUS | Status: DC | PRN

## 2024-07-14 MED ORDER — FENTANYL CITRATE (PF) 100 MCG/2ML IJ SOLN
INTRAMUSCULAR | Status: AC
  Filled 2024-07-14: qty 2

## 2024-07-14 MED ORDER — EPHEDRINE 5 MG/ML INJ
INTRAVENOUS | Status: AC
  Filled 2024-07-14: qty 5

## 2024-07-14 MED ORDER — ACETIC ACID 4% SOLUTION
Status: DC | PRN
Start: 2024-07-14 — End: 2024-07-14
  Administered 2024-07-14: 1 via TOPICAL

## 2024-07-14 MED ORDER — MIDAZOLAM HCL (PF) 2 MG/2ML IJ SOLN
INTRAMUSCULAR | Status: DC | PRN
Start: 2024-07-14 — End: 2024-07-14
  Administered 2024-07-14: 2 mg via INTRAVENOUS

## 2024-07-14 MED ORDER — ONDANSETRON HCL 4 MG/2ML IJ SOLN
INTRAMUSCULAR | Status: AC
  Filled 2024-07-14: qty 2

## 2024-07-14 MED ORDER — MONSELS FERRIC SUBSULFATE EX SOLN
CUTANEOUS | Status: AC
  Filled 2024-07-14: qty 8

## 2024-07-14 MED ORDER — CEFAZOLIN SODIUM-DEXTROSE 2-3 GM-%(50ML) IV SOLR
INTRAVENOUS | Status: DC | PRN
Start: 2024-07-14 — End: 2024-07-14
  Administered 2024-07-14: 2 g via INTRAVENOUS

## 2024-07-14 MED ORDER — ACETAMINOPHEN 325 MG PO TABS
650.0000 mg | ORAL_TABLET | Freq: Four times a day (QID) | ORAL | Status: AC | PRN
Start: 2024-07-14 — End: ?

## 2024-07-14 MED ORDER — KETOROLAC TROMETHAMINE 30 MG/ML IJ SOLN
30.0000 mg | INTRAMUSCULAR | Status: AC
  Administered 2024-07-14: 30 mg via INTRAVENOUS

## 2024-07-14 MED ORDER — LIDOCAINE-EPINEPHRINE 0.5 %-1:200000 IJ SOLN
INTRAMUSCULAR | Status: DC | PRN
  Administered 2024-07-14: 20 mL

## 2024-07-14 MED ORDER — LEVONORGESTREL 20 MCG/DAY IU IUD
1.0000 | INTRAUTERINE_SYSTEM | INTRAUTERINE | Status: AC
  Administered 2024-07-14: 1 via INTRAUTERINE
  Filled 2024-07-14: qty 1

## 2024-07-14 MED ORDER — CEFAZOLIN SODIUM-DEXTROSE 2-4 GM/100ML-% IV SOLN
INTRAVENOUS | Status: AC
  Filled 2024-07-14: qty 100

## 2024-07-14 MED ORDER — 0.9 % SODIUM CHLORIDE (POUR BTL) OPTIME
TOPICAL | Status: DC | PRN
Start: 2024-07-14 — End: 2024-07-14
  Administered 2024-07-14: 1000 mL

## 2024-07-14 MED ORDER — PROPOFOL 500 MG/50ML IV EMUL
INTRAVENOUS | Status: DC | PRN
  Administered 2024-07-14: 30 mg via INTRAVENOUS
  Administered 2024-07-14: 150 mg via INTRAVENOUS

## 2024-07-14 MED ORDER — LIDOCAINE 2% (20 MG/ML) 5 ML SYRINGE
INTRAMUSCULAR | Status: AC
  Filled 2024-07-14: qty 5

## 2024-07-14 MED ORDER — KETOROLAC TROMETHAMINE 30 MG/ML IJ SOLN
INTRAMUSCULAR | Status: DC
Start: 2024-07-14 — End: 2024-07-14
  Filled 2024-07-14: qty 1

## 2024-07-14 MED ORDER — ACETAMINOPHEN 500 MG PO TABS
ORAL_TABLET | ORAL | Status: AC
  Filled 2024-07-14: qty 2

## 2024-07-14 MED ORDER — OXYCODONE HCL 5 MG/5ML PO SOLN: 5.0000 mg | Freq: Once | ORAL | Status: DC | PRN

## 2024-07-14 MED ORDER — OXYCODONE HCL 5 MG PO TABS: 5.0000 mg | ORAL_TABLET | Freq: Once | ORAL | Status: DC | PRN

## 2024-07-14 MED ORDER — ACETAMINOPHEN 160 MG/5ML PO SOLN
960.0000 mg | Freq: Once | ORAL | Status: AC
  Filled 2024-07-14: qty 30

## 2024-07-14 MED ORDER — PROPOFOL 500 MG/50ML IV EMUL
INTRAVENOUS | Status: AC
  Filled 2024-07-14: qty 50

## 2024-07-14 MED ORDER — ONDANSETRON HCL 4 MG/2ML IJ SOLN
INTRAMUSCULAR | Status: DC | PRN
  Administered 2024-07-14: 4 mg via INTRAVENOUS

## 2024-07-14 MED ORDER — LACTATED RINGERS IV SOLN: INTRAVENOUS | Status: DC

## 2024-07-14 MED ORDER — MIDAZOLAM HCL 2 MG/2ML IJ SOLN
INTRAMUSCULAR | Status: AC
  Filled 2024-07-14: qty 2

## 2024-07-14 MED ORDER — MONSELS FERRIC SUBSULFATE EX SOLN
CUTANEOUS | Status: DC | PRN
  Administered 2024-07-14: 1 via TOPICAL

## 2024-07-14 MED ORDER — SCOPOLAMINE 1 MG/3DAYS TD PT72
MEDICATED_PATCH | TRANSDERMAL | Status: AC
  Filled 2024-07-14: qty 1

## 2024-07-14 MED ORDER — IBUPROFEN 600 MG PO TABS: 600.0000 mg | ORAL_TABLET | Freq: Four times a day (QID) | ORAL | 0 refills | Status: AC | PRN

## 2024-07-14 MED ORDER — SODIUM CHLORIDE 0.9 % IR SOLN
Status: DC | PRN
  Administered 2024-07-14: 3000 mL

## 2024-07-14 MED ORDER — DEXAMETHASONE SOD PHOSPHATE PF 10 MG/ML IJ SOLN
INTRAMUSCULAR | Status: DC | PRN
Start: 2024-07-14 — End: 2024-07-14
  Administered 2024-07-14: 6 mg via INTRAVENOUS

## 2024-07-14 MED ORDER — LIDOCAINE 2% (20 MG/ML) 5 ML SYRINGE
INTRAMUSCULAR | Status: DC | PRN
  Administered 2024-07-14: 60 mg via INTRAVENOUS

## 2024-07-14 MED ORDER — EPHEDRINE SULFATE (PRESSORS) 25 MG/5ML IV SOSY
PREFILLED_SYRINGE | INTRAVENOUS | Status: DC | PRN
  Administered 2024-07-14: 10 mg via INTRAVENOUS

## 2024-07-14 SURGICAL SUPPLY — 32 items
APPLICATOR COTTON TIP 6 STRL (MISCELLANEOUS) ×2 IMPLANT
CLOTH BEACON ORANGE TIMEOUT ST (SAFETY) ×2 IMPLANT
COVER LIGHT HANDLE (MISCELLANEOUS) IMPLANT
ELECT BALL LEEP 5MM RED (ELECTRODE) ×2 IMPLANT
ELECTRODE LOOP LP RND 20X12WHT (CUTTING LOOP) IMPLANT
ELECTRODE LOOP LP SQR 10X10ORG (CUTTING LOOP) ×2 IMPLANT
ELECTRODE REM PT RTRN 9FT ADLT (ELECTROSURGICAL) ×2 IMPLANT
GAUZE 4X4 16PLY ~~LOC~~+RFID DBL (SPONGE) ×4 IMPLANT
GLOVE BIO SURGEON STRL SZ 6.5 (GLOVE) ×2 IMPLANT
GLOVE BIO SURGEON STRL SZ7 (GLOVE) ×2 IMPLANT
GLOVE BIOGEL PI IND STRL 7.0 (GLOVE) ×6 IMPLANT
GOWN STRL REUS W/ TWL LRG LVL3 (GOWN DISPOSABLE) ×2 IMPLANT
GOWN STRL REUS W/TWL LRG LVL3 (GOWN DISPOSABLE) ×2 IMPLANT
KIT PROCEDURE FLUENT (KITS) ×2 IMPLANT
KIT TURNOVER CYSTO (KITS) ×2 IMPLANT
KIT TURNOVER KIT A (KITS) ×2 IMPLANT
NDL HYPO 18GX1.5 BLUNT FILL (NEEDLE) ×1 IMPLANT
NEEDLE HYPO 18GX1.5 BLUNT FILL (NEEDLE) ×2 IMPLANT
NS IRRIG 500ML POUR BTL (IV SOLUTION) ×2 IMPLANT
PACK PERI GYN (CUSTOM PROCEDURE TRAY) ×2 IMPLANT
PAD ARMBOARD POSITIONER FOAM (MISCELLANEOUS) ×2 IMPLANT
PAD TELFA 3X4 1S STER (GAUZE/BANDAGES/DRESSINGS) ×2 IMPLANT
POSITIONER HEAD 8X9X4 ADT (SOFTGOODS) ×2 IMPLANT
SCOPETTES 8 STERILE (MISCELLANEOUS) ×2 IMPLANT
SEAL ROD LENS SCOPE MYOSURE (ABLATOR) ×2 IMPLANT
SET BASIN LINEN APH (SET/KITS/TRAYS/PACK) ×2 IMPLANT
SOL .9 NS 3000ML IRR UROMATIC (IV SOLUTION) ×2 IMPLANT
SOLN 0.9% NACL POUR BTL 1000ML (IV SOLUTION) ×2 IMPLANT
SUT VIC AB 0 CT1 27XBRD ANBCTR (SUTURE) ×2 IMPLANT
SYR 30ML LL (SYRINGE) ×2 IMPLANT
SYR CONTROL 10ML LL (SYRINGE) ×2 IMPLANT
UNDERPAD 30X36 HEAVY ABSORB (UNDERPADS AND DIAPERS) ×2 IMPLANT

## 2024-07-14 NOTE — Discharge Instructions (Addendum)
 HOME INSTRUCTIONS  Please note any unusual or excessive bleeding, pain, swelling. Mild dizziness or drowsiness are normal for about 24 hours after surgery.   Shower when comfortable  Restrictions: No driving for 24 hours or while taking pain medications.  Activity:  Nothing in vagina (no tampons, douching, or intercourse) x 2-3 weeks; no tub baths for 2-3 weeks Vaginal spotting is expected but if your bleeding is heavy, period like,  please call the office   Diet:  You may return to your regular diet.  Do not eat large meals.  Eat small frequent meals throughout the day.  Continue to drink a good amount of water at least 6-8 glasses of water per day, hydration is very important for the healing process.  Pain Management: Take over the counter tylenol  or ibuprofen  as needed for pain.  You can either take one or alternate between the two medications for pain management.  You may also use a heating pack as needed.    Alcohol -- Avoid for 24 hours and while taking pain medications.  Nausea: Take sips of ginger ale or soda  Fever -- Call physician if temperature over 101 degrees  Follow up:  If you do not already have a follow up appointment scheduled, please call the office at [phone].  If you experience fever (a temperature greater than 100.4), pain unrelieved by pain medication, shortness of breath, swelling of a single leg, or any other symptoms which are concerning to you please the office immediately.

## 2024-07-14 NOTE — Progress Notes (Deleted)
 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 Delon Prude ANESTHESIA: General endotracheal.  ESTIMATED BLOOD LOSS: minimal  IV FLUIDS: 400cc of crystalloid.  SPECIMEN(S): cervical biopsy COMPLICATIONS: None.  CONDITION: Stable.  FINDINGS: 8cm anteverted uterus- no abnormalities noted  ??Procedure: Informed consent was obtained from the patient prior to taking her to the operating room where anesthesia was found to be adequate. She was placed in dorsal lithotomy.  She was prepped and draped in normal sterile fashion.  A bivalved coated speculum was placed in the patient's vagina. A grounding pad placed on the patient. A single tooth tenaculum was placed on the anterior lip of the cervix. Cervical block was completed using 0.5% lidocaine  with epinephrine.  The uterus was then sounded to 8cm. The endocervical canal already appeared slightly dilated.  The hysteroscope was inserted- the IUD strings were visualized within the cervical canal.  Strings were grasped and device was removed without complications.  Smaller hysteroscope was inserted, no abnormalities were noted.  The hysteroscope was removed and attention was turned to the LEEP portion of the procedure.   Acetic acid solution was applied to the cervix and areas of decreased uptake were noted around the transformation zone.   The suction was turned on and the Large 1X Fisher Cone Biopsy Excisor on 50 Watts of blended current was used to excise the area of decreased uptake and excise the entire transformation zone. Excellent hemostasis was achieved using roller ball coagulation set at 50 Watts coagulation current.     Mirena   IUD placed per manufacturer's recommendations. The strings were trimmed to approximately 3 cm. Monsel's solution was then applied and the  speculum was removed from the vagina. Specimens were sent to pathology.  Counts were correct and pt was taken to recovery room in stable condition.  Diamantina Edinger, DO Attending Obstetrician & Gynecologist, [facility] for [employer], [facility]

## 2024-07-14 NOTE — Op Note (Signed)
 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 Delon Prude ANESTHESIA: General endotracheal.  ESTIMATED BLOOD LOSS: minimal  IV FLUIDS: 400cc of crystalloid.  SPECIMEN(S): cervical biopsy COMPLICATIONS: None.  CONDITION: Stable.  FINDINGS: 8cm anteverted uterus- no abnormalities noted  ??Procedure: Informed consent was obtained from the patient prior to taking her to the operating room where anesthesia was found to be adequate. She was placed in dorsal lithotomy.  She was prepped and draped in normal sterile fashion.  A bivalved coated speculum was placed in the patient's vagina. A grounding pad placed on the patient. A single tooth tenaculum was placed on the anterior lip of the cervix. Cervical block was completed using 0.5% lidocaine  with epinephrine.  The uterus was then sounded to 8cm. The endocervical canal already appeared slightly dilated.  The hysteroscope was inserted- the IUD strings were visualized within the cervical canal.  Strings were grasped and device was removed without complications.  Smaller hysteroscope was inserted, no abnormalities were noted.  The hysteroscope was removed and attention was turned to the LEEP portion of the procedure.   Acetic acid solution was applied to the cervix and areas of decreased uptake were noted around the transformation zone.   The suction was turned on and the Large 1X Fisher Cone Biopsy Excisor on 50 Watts of blended current was used to excise the area of decreased uptake and excise the entire transformation zone. Excellent hemostasis was achieved using roller ball coagulation set at 50 Watts coagulation current.     Mirena   IUD placed per manufacturer's recommendations. The strings were trimmed to approximately 3 cm. Monsel's solution was then applied and the  speculum was removed from the vagina. Specimens were sent to pathology.  Counts were correct and pt was taken to recovery room in stable condition.  Diamantina Edinger, DO Attending Obstetrician & Gynecologist, [facility] for [employer], [facility]

## 2024-07-14 NOTE — Transfer of Care (Addendum)
 Immediate Anesthesia Transfer of Care Note  Patient: Heather Matthews  Procedure(s) Performed: DILATATION AND CURETTAGE /HYSTEROSCOPY (Vagina ) REMOVAL, INTRAUTERINE DEVICE (Vagina ) INSERTION, INTRAUTERINE DEVICE (Vagina ) LEEP (LOOP ELECTROSURGICAL EXCISION PROCEDURE) (Vagina )  Patient Location: PACU  Anesthesia Type:General  Level of Consciousness: drowsy and patient cooperative  Airway & Oxygen Therapy: Patient Spontanous Breathing  Post-op Assessment: Report given to RN and Post -op Vital signs reviewed and stable  Post vital signs: Reviewed and stable  Last Vitals:  Vitals Value Taken Time  BP 129/83 07/14/24   0827  Temp 37.1 07/14/24   0827  Pulse 101 07/14/24   0827  Resp 16 07/14/24   0827  SpO2 99% 07/14/24   0827    Last Pain:  Vitals:   07/14/24 0654  TempSrc: Oral  PainSc: 0-No pain      Patients Stated Pain Goal: 5 (07/14/24 0654)  Complications: No notable events documented.

## 2024-07-14 NOTE — Anesthesia Procedure Notes (Signed)
 Procedure Name: LMA Insertion Date/Time: 07/14/2024 7:41 AM  Performed by: Para Jerelene CROME, CRNAPre-anesthesia Checklist: Patient identified, Emergency Drugs available, Suction available and Patient being monitored Patient Re-evaluated:Patient Re-evaluated prior to induction Oxygen Delivery Method: Circle system utilized Preoxygenation: Pre-oxygenation with 100% oxygen Induction Type: IV induction Ventilation: Mask ventilation without difficulty LMA: LMA inserted LMA Size: 4.0 Number of attempts: 1 Placement Confirmation: positive ETCO2, CO2 detector and breath sounds checked- equal and bilateral ETT to lip (cm): No markings on Ambu Aurastraight LMA size 4. Tube secured with: Tape Comments: Atraumatic insertion of LMA size 4. Lips and teeth remain in preoperative condition.

## 2024-07-14 NOTE — Anesthesia Postprocedure Evaluation (Signed)
 Anesthesia Post Note  Patient: Heather Matthews  Procedure(s) Performed: DILATATION AND CURETTAGE /HYSTEROSCOPY (Vagina ) REMOVAL, INTRAUTERINE DEVICE (Vagina ) INSERTION, INTRAUTERINE DEVICE (Vagina ) LEEP (LOOP ELECTROSURGICAL EXCISION PROCEDURE) (Vagina )  Patient location during evaluation: PACU Anesthesia Type: General Level of consciousness: awake and alert Pain management: pain level controlled Vital Signs Assessment: post-procedure vital signs reviewed and stable Respiratory status: spontaneous breathing, nonlabored ventilation, respiratory function stable and patient connected to nasal cannula oxygen Cardiovascular status: blood pressure returned to baseline and stable Postop Assessment: no apparent nausea or vomiting Anesthetic complications: no   There were no known notable events for this encounter.   Last Vitals:  Vitals:   07/14/24 0845 07/14/24 0903  BP: 131/86 122/80  Pulse: 95 74  Resp: 17 13  Temp:  36.9 C  SpO2: 96% 97%    Last Pain:  Vitals:   07/14/24 0903  TempSrc: Oral  PainSc: 0-No pain                 [name]

## 2024-07-15 ENCOUNTER — Encounter: Payer: Self-pay | Admitting: Obstetrics & Gynecology

## 2024-07-15 ENCOUNTER — Encounter (HOSPITAL_COMMUNITY): Payer: Self-pay | Admitting: Obstetrics & Gynecology

## 2024-07-17 ENCOUNTER — Ambulatory Visit: Payer: Self-pay | Admitting: Obstetrics & Gynecology

## 2024-07-20 LAB — SURGICAL PATHOLOGY

## 2024-07-23 ENCOUNTER — Encounter: Payer: Self-pay | Admitting: Obstetrics & Gynecology

## 2024-07-23 ENCOUNTER — Ambulatory Visit (INDEPENDENT_AMBULATORY_CARE_PROVIDER_SITE_OTHER): Admitting: Obstetrics & Gynecology

## 2024-07-23 VITALS — BP 143/90 | HR 82 | Ht 62.0 in | Wt 180.2 lb

## 2024-07-23 DIAGNOSIS — Z9889 Other specified postprocedural states: Secondary | ICD-10-CM

## 2024-07-23 DIAGNOSIS — Z975 Presence of (intrauterine) contraceptive device: Secondary | ICD-10-CM

## 2024-07-23 NOTE — Progress Notes (Addendum)
    PostOp Visit Note  Heather Matthews is a 38 y.o. [id number] female who presents for a postoperative visit. She is 1 week postop following a LEEP and Mirena  insertion completed on 11/4.   Today she notes that she has had some coffee-ground discharge, at times seems to just gush out.  Denies odor or itching. Denies fever or chills.  Tolerating gen diet.  +Flatus, Regular BMs.  Overall doing well and reports no acute complaints  Reviewed pathology- margins negative   Review of Systems Pertinent items are noted in HPI.    Objective:  BP (!) 143/90 (BP Location: Right Arm, Patient Position: Sitting, Cuff Size: Normal)   Pulse 82   Ht 5' 2 (1.575 m)   Wt 180 lb 3.2 oz (81.7 kg)   BMI 32.96 kg/m    Physical Examination:  GENERAL ASSESSMENT: well developed and well nourished SKIN: warm and dry CHEST: normal air exchange, respiratory effort normal with no retractions HEART: regular rate and rhythm ABDOMEN: soft, non-distended GU: minimal coffee-ground discharge c/w monsels- no irregular discharge or bleeding.  Difficulty seeing strings due to position/healing cervix EXTREMITY: no edema PSYCH: mood appropriate, normal affect       Assessment:    S/p LEEP, Mirena  insertion   Plan:   -meeting milestones appropriately -plan to f/u in 6 mos per ASCCP guidelines  Primrose Oler, DO Attending Obstetrician & Gynecologist, [facility] [employer], [facility]

## 2024-07-24 ENCOUNTER — Other Ambulatory Visit: Payer: Self-pay | Admitting: Adult Health

## 2024-08-03 ENCOUNTER — Other Ambulatory Visit (HOSPITAL_COMMUNITY)
